# Patient Record
Sex: Male | Born: 1960 | Race: White | Hispanic: No | Marital: Married | Smoking: Never smoker
Health system: Southern US, Community
[De-identification: ages and names within clinical notes are randomized; demographics above are authoritative.]

## PROBLEM LIST (undated history)

## (undated) ENCOUNTER — Ambulatory Visit: Admission: EM | Payer: 59

## (undated) DIAGNOSIS — G479 Sleep disorder, unspecified: Secondary | ICD-10-CM

## (undated) DIAGNOSIS — E785 Hyperlipidemia, unspecified: Secondary | ICD-10-CM

## (undated) DIAGNOSIS — I10 Essential (primary) hypertension: Secondary | ICD-10-CM

## (undated) HISTORY — DX: Essential (primary) hypertension: I10

## (undated) HISTORY — DX: Hyperlipidemia, unspecified: E78.5

## (undated) HISTORY — DX: Sleep disorder, unspecified: G47.9

---

## 2004-03-11 ENCOUNTER — Ambulatory Visit: Payer: Self-pay | Admitting: Unknown Physician Specialty

## 2006-10-26 ENCOUNTER — Ambulatory Visit: Payer: Self-pay | Admitting: Emergency Medicine

## 2006-11-04 ENCOUNTER — Ambulatory Visit: Payer: Self-pay | Admitting: Internal Medicine

## 2006-11-10 ENCOUNTER — Ambulatory Visit: Payer: Self-pay | Admitting: Internal Medicine

## 2009-08-14 ENCOUNTER — Ambulatory Visit: Payer: Self-pay | Admitting: Family Medicine

## 2009-09-25 ENCOUNTER — Ambulatory Visit: Payer: Self-pay | Admitting: Family Medicine

## 2012-02-10 ENCOUNTER — Ambulatory Visit: Payer: Self-pay | Admitting: Unknown Physician Specialty

## 2012-02-24 ENCOUNTER — Ambulatory Visit: Payer: Self-pay | Admitting: Physical Medicine and Rehabilitation

## 2012-12-17 ENCOUNTER — Ambulatory Visit: Payer: Self-pay | Admitting: Unknown Physician Specialty

## 2013-05-05 ENCOUNTER — Ambulatory Visit: Payer: Self-pay | Admitting: Family Medicine

## 2013-05-27 ENCOUNTER — Ambulatory Visit: Payer: Self-pay | Admitting: Vascular Surgery

## 2016-04-11 DIAGNOSIS — F5101 Primary insomnia: Secondary | ICD-10-CM | POA: Diagnosis not present

## 2016-04-11 DIAGNOSIS — L409 Psoriasis, unspecified: Secondary | ICD-10-CM | POA: Diagnosis not present

## 2016-04-11 DIAGNOSIS — E7800 Pure hypercholesterolemia, unspecified: Secondary | ICD-10-CM | POA: Diagnosis not present

## 2016-07-08 DIAGNOSIS — R05 Cough: Secondary | ICD-10-CM | POA: Diagnosis not present

## 2016-07-14 DIAGNOSIS — J40 Bronchitis, not specified as acute or chronic: Secondary | ICD-10-CM | POA: Diagnosis not present

## 2016-07-14 DIAGNOSIS — R05 Cough: Secondary | ICD-10-CM | POA: Diagnosis not present

## 2016-10-16 DIAGNOSIS — E7800 Pure hypercholesterolemia, unspecified: Secondary | ICD-10-CM | POA: Diagnosis not present

## 2016-10-16 DIAGNOSIS — Z0000 Encounter for general adult medical examination without abnormal findings: Secondary | ICD-10-CM | POA: Diagnosis not present

## 2016-10-16 DIAGNOSIS — L409 Psoriasis, unspecified: Secondary | ICD-10-CM | POA: Diagnosis not present

## 2016-11-21 DIAGNOSIS — J3489 Other specified disorders of nose and nasal sinuses: Secondary | ICD-10-CM | POA: Diagnosis not present

## 2016-11-21 DIAGNOSIS — G4733 Obstructive sleep apnea (adult) (pediatric): Secondary | ICD-10-CM | POA: Diagnosis not present

## 2016-11-21 DIAGNOSIS — J342 Deviated nasal septum: Secondary | ICD-10-CM | POA: Diagnosis not present

## 2016-12-05 DIAGNOSIS — J40 Bronchitis, not specified as acute or chronic: Secondary | ICD-10-CM | POA: Diagnosis not present

## 2017-01-04 DIAGNOSIS — Z23 Encounter for immunization: Secondary | ICD-10-CM | POA: Diagnosis not present

## 2017-03-30 ENCOUNTER — Other Ambulatory Visit: Payer: Self-pay | Admitting: Family Medicine

## 2017-03-30 DIAGNOSIS — L539 Erythematous condition, unspecified: Secondary | ICD-10-CM

## 2017-03-30 DIAGNOSIS — M7989 Other specified soft tissue disorders: Secondary | ICD-10-CM

## 2017-04-01 ENCOUNTER — Ambulatory Visit: Payer: 59

## 2017-04-02 ENCOUNTER — Ambulatory Visit (INDEPENDENT_AMBULATORY_CARE_PROVIDER_SITE_OTHER): Payer: 59 | Admitting: Vascular Surgery

## 2017-04-02 ENCOUNTER — Other Ambulatory Visit (INDEPENDENT_AMBULATORY_CARE_PROVIDER_SITE_OTHER): Payer: Self-pay | Admitting: Vascular Surgery

## 2017-04-02 ENCOUNTER — Ambulatory Visit (INDEPENDENT_AMBULATORY_CARE_PROVIDER_SITE_OTHER): Payer: 59

## 2017-04-02 ENCOUNTER — Encounter (INDEPENDENT_AMBULATORY_CARE_PROVIDER_SITE_OTHER): Payer: Self-pay | Admitting: Vascular Surgery

## 2017-04-02 VITALS — BP 135/93 | HR 68 | Resp 18 | Ht 70.0 in | Wt 232.0 lb

## 2017-04-02 DIAGNOSIS — I8002 Phlebitis and thrombophlebitis of superficial vessels of left lower extremity: Secondary | ICD-10-CM

## 2017-04-02 DIAGNOSIS — R601 Generalized edema: Secondary | ICD-10-CM

## 2017-04-02 DIAGNOSIS — E785 Hyperlipidemia, unspecified: Secondary | ICD-10-CM

## 2017-04-02 DIAGNOSIS — M7989 Other specified soft tissue disorders: Secondary | ICD-10-CM

## 2017-04-03 ENCOUNTER — Encounter (INDEPENDENT_AMBULATORY_CARE_PROVIDER_SITE_OTHER): Payer: Self-pay | Admitting: Vascular Surgery

## 2017-04-03 DIAGNOSIS — I8002 Phlebitis and thrombophlebitis of superficial vessels of left lower extremity: Secondary | ICD-10-CM | POA: Insufficient documentation

## 2017-04-03 DIAGNOSIS — E785 Hyperlipidemia, unspecified: Secondary | ICD-10-CM | POA: Insufficient documentation

## 2017-04-03 NOTE — Progress Notes (Signed)
Subjective:    Patient ID: Kurt Hammond, male    DOB: 29-May-1960, 57 y.o.   MRN: [id number] Chief Complaint  Patient presents with  . Follow-up    Left leg swelling and redness   Patient presents with a chief complaint of left lower extremity "swelling".  The patient was last seen in 2015 and diagnosed with an extensive left lower extremity great saphenous vein thrombo-phlebitis.  At that time, the patient was placed on anticoagulation due to the extensive nature of his blood clot.  Since his initial diagnosis, the patient has not continued engaging in conservative therapy including wearing medical grade 1 compression stockings and elevating his legs.  The patient has noticed about a week's worsening of left lower extremity swelling.  He also notes a "hard spot" noted to the front of his left calf.  This "spot" is very tender and red.  The patient denies any claudication-like symptoms, rest pain or ulceration to the lower extremity.  The patient denies any fever, nausea or vomiting.  The patient denies any chest pain or shortness of breath.   Review of Systems  Constitutional: Negative.   HENT: Negative.   Eyes: Negative.   Respiratory: Negative.   Cardiovascular: Positive for leg swelling.  Gastrointestinal: Negative.   Endocrine: Negative.   Genitourinary: Negative.   Musculoskeletal: Negative.   Skin: Negative.   Allergic/Immunologic: Negative.   Neurological: Negative.   Hematological: Negative.   Psychiatric/Behavioral: Negative.       Objective:   Physical Exam  Constitutional: He is oriented to person, place, and time. He appears well-developed and well-nourished. No distress.  HENT:  Head: Normocephalic and atraumatic.  Eyes: Conjunctivae are normal. Pupils are equal, round, and reactive to light.  Neck: Normal range of motion.  Cardiovascular: Normal rate, regular rhythm, normal heart sounds and intact distal pulses.  Pulses:      Radial pulses are 2+ on the right  side, and 2+ on the left side.  Left lower extremity: There is no pain with dorsiflexion.  There is no pain with palpation.  There is a warm hard nodule located to the front of the left calf measuring approximately 2 cm x 2 cm.  Skin is intact.  This area is erythematous and warm.  Pulmonary/Chest: Effort normal and breath sounds normal.  Musculoskeletal: Normal range of motion. He exhibits edema (Mild left lower extremity edema).  Neurological: He is alert and oriented to person, place, and time.  Skin: He is not diaphoretic.  Psychiatric: He has a normal mood and affect. His behavior is normal. Judgment and thought content normal.  Vitals reviewed.  BP (!) 135/93 (BP Location: Right Arm, Patient Position: Sitting)   Pulse 68   Resp 18   Ht 5\' 10"  (1.778 m)   Wt 232 lb (105.2 kg)   BMI 33.29 kg/m   Past Medical History:  Diagnosis Date  . Hyperlipidemia   . Hypertension   . Sleep difficulties    Social History   Socioeconomic History  . Marital status: Married    Spouse name: Not on file  . Number of children: Not on file  . Years of education: Not on file  . Highest education level: Not on file  Social Needs  . Financial resource strain: Not on file  . Food insecurity - worry: Not on file  . Food insecurity - inability: Not on file  . Transportation needs - medical: Not on file  . Transportation needs - non-medical: Not on  file  Occupational History  . Not on file  Tobacco Use  . Smoking status: Never Smoker  . Smokeless tobacco: Never Used  Substance and Sexual Activity  . Alcohol use: Yes  . Drug use: Not on file  . Sexual activity: Not on file  Other Topics Concern  . Not on file  Social History Narrative  . Not on file   No family history on file.  No Known Allergies     Assessment & Plan:  Patient presents with a chief complaint of left lower extremity "swelling".  The patient was last seen in 2015 and diagnosed with an extensive left lower extremity  great saphenous vein thrombo-phlebitis.  At that time, the patient was placed on anticoagulation due to the extensive nature of his blood clot.  Since his initial diagnosis, the patient has not continued engaging in conservative therapy including wearing medical grade 1 compression stockings and elevating his legs.  The patient has noticed about a week's worsening of left lower extremity swelling.  He also notes a "hard spot" noted to the front of his left calf.  This "spot" is very tender and red.  The patient denies any claudication-like symptoms, rest pain or ulceration to the lower extremity.  The patient denies any fever, nausea or vomiting.  The patient denies any chest pain or shortness of breath.  1. Thrombophlebitis of superficial veins of left lower extremity - New The patient underwent a STAT left lower extremity DVT study which was notable for no deep vein thrombosis.  There is an acute superficial thrombosis of the great saphenous vein in the proximal calf region and superficial varicosities of the proximal calf region. Recommend baby aspirin daily, warm compresses to the area, wearing medical grade one compression stockings on a daily basis, elevation and remaining active. I also recommended the patient undergo a formal venous workup as he may be prone to experiencing superficial vein thrombosis if he has venous disease. The patient will think about this and if he decides to move forward he will make an appointment If the patient experiences any fever nausea vomiting worsening pain or erythema or breakdown the skin he will call the office  - VAS US LOWER EXTREMITY VENOUS (DVT); Future  2. Hyperlipidemia, unspecified hyperlipidemia type - Stable Encouraged good control as its slows the progression of atherosclerotic disease  Current Outpatient Medications on File Prior to Visit  Medication Sig Dispense Refill  . albuterol (VENTOLIN HFA) 108 (90 Base) MCG/ACT inhaler Inhale into the  lungs.    . pravastatin (PRAVACHOL) 10 MG tablet pravastatin 10 mg tablet  TK 1 T PO QD    . zolpidem (AMBIEN) 10 MG tablet TK 1 T PO QHS PRN  0  . lovastatin (MEVACOR) 20 MG tablet Take by mouth.     No current facility-administered medications on file prior to visit.    There are no Patient Instructions on file for this visit. No Follow-up on file.  Ahmani Daoud A Moriyah Byington, PA-C

## 2017-04-10 ENCOUNTER — Ambulatory Visit (INDEPENDENT_AMBULATORY_CARE_PROVIDER_SITE_OTHER): Payer: Self-pay | Admitting: Vascular Surgery

## 2017-04-13 DIAGNOSIS — E7800 Pure hypercholesterolemia, unspecified: Secondary | ICD-10-CM | POA: Diagnosis not present

## 2017-04-13 DIAGNOSIS — F5101 Primary insomnia: Secondary | ICD-10-CM | POA: Diagnosis not present

## 2017-04-13 DIAGNOSIS — L409 Psoriasis, unspecified: Secondary | ICD-10-CM | POA: Diagnosis not present

## 2017-04-14 DIAGNOSIS — E7800 Pure hypercholesterolemia, unspecified: Secondary | ICD-10-CM | POA: Diagnosis not present

## 2017-04-14 DIAGNOSIS — N410 Acute prostatitis: Secondary | ICD-10-CM | POA: Diagnosis not present

## 2017-04-20 DIAGNOSIS — K429 Umbilical hernia without obstruction or gangrene: Secondary | ICD-10-CM | POA: Diagnosis not present

## 2017-10-19 DIAGNOSIS — E7800 Pure hypercholesterolemia, unspecified: Secondary | ICD-10-CM | POA: Diagnosis not present

## 2017-10-19 DIAGNOSIS — L409 Psoriasis, unspecified: Secondary | ICD-10-CM | POA: Diagnosis not present

## 2017-10-19 DIAGNOSIS — Z125 Encounter for screening for malignant neoplasm of prostate: Secondary | ICD-10-CM | POA: Diagnosis not present

## 2017-10-19 DIAGNOSIS — Z0000 Encounter for general adult medical examination without abnormal findings: Secondary | ICD-10-CM | POA: Diagnosis not present

## 2017-11-17 DIAGNOSIS — G4733 Obstructive sleep apnea (adult) (pediatric): Secondary | ICD-10-CM | POA: Diagnosis not present

## 2017-11-24 DIAGNOSIS — G4733 Obstructive sleep apnea (adult) (pediatric): Secondary | ICD-10-CM | POA: Diagnosis not present

## 2017-12-16 DIAGNOSIS — G4733 Obstructive sleep apnea (adult) (pediatric): Secondary | ICD-10-CM | POA: Diagnosis not present

## 2018-01-11 DIAGNOSIS — Z23 Encounter for immunization: Secondary | ICD-10-CM | POA: Diagnosis not present

## 2018-04-30 DIAGNOSIS — L409 Psoriasis, unspecified: Secondary | ICD-10-CM | POA: Diagnosis not present

## 2018-04-30 DIAGNOSIS — F5101 Primary insomnia: Secondary | ICD-10-CM | POA: Diagnosis not present

## 2018-04-30 DIAGNOSIS — E7800 Pure hypercholesterolemia, unspecified: Secondary | ICD-10-CM | POA: Diagnosis not present

## 2018-05-07 DIAGNOSIS — R079 Chest pain, unspecified: Secondary | ICD-10-CM | POA: Diagnosis not present

## 2018-11-25 ENCOUNTER — Other Ambulatory Visit: Payer: Self-pay | Admitting: Physician Assistant

## 2018-11-25 ENCOUNTER — Other Ambulatory Visit (HOSPITAL_COMMUNITY): Payer: Self-pay | Admitting: Physician Assistant

## 2018-11-25 DIAGNOSIS — M25512 Pain in left shoulder: Secondary | ICD-10-CM

## 2018-12-08 ENCOUNTER — Other Ambulatory Visit: Payer: Self-pay

## 2018-12-08 ENCOUNTER — Ambulatory Visit
Admission: RE | Admit: 2018-12-08 | Discharge: 2018-12-08 | Disposition: A | Discharge status: Home / Self Care | Payer: 59 | Source: Ambulatory Visit | Attending: Physician Assistant | Admitting: Physician Assistant

## 2018-12-08 DIAGNOSIS — M25512 Pain in left shoulder: Secondary | ICD-10-CM | POA: Diagnosis not present

## 2019-02-24 ENCOUNTER — Other Ambulatory Visit: Payer: Self-pay

## 2019-02-24 ENCOUNTER — Emergency Department: Payer: 59

## 2019-02-24 ENCOUNTER — Emergency Department
Admission: EM | Admit: 2019-02-24 | Discharge: 2019-02-24 | Disposition: A | Discharge status: Home / Self Care | Payer: 59 | Attending: Emergency Medicine | Admitting: Emergency Medicine

## 2019-02-24 DIAGNOSIS — R0789 Other chest pain: Secondary | ICD-10-CM | POA: Diagnosis present

## 2019-02-24 DIAGNOSIS — I10 Essential (primary) hypertension: Secondary | ICD-10-CM | POA: Diagnosis not present

## 2019-02-24 DIAGNOSIS — I300 Acute nonspecific idiopathic pericarditis: Secondary | ICD-10-CM | POA: Diagnosis not present

## 2019-02-24 DIAGNOSIS — Z79899 Other long term (current) drug therapy: Secondary | ICD-10-CM | POA: Insufficient documentation

## 2019-02-24 LAB — CBC
HCT: 45.5 % (ref 39.0–52.0)
Hemoglobin: 16 g/dL (ref 13.0–17.0)
MCH: 30.8 pg (ref 26.0–34.0)
MCHC: 35.2 g/dL (ref 30.0–36.0)
MCV: 87.5 fL (ref 80.0–100.0)
Platelets: 211 10*3/uL (ref 150–400)
RBC: 5.2 MIL/uL (ref 4.22–5.81)
RDW: 13.1 % (ref 11.5–15.5)
WBC: 6.8 10*3/uL (ref 4.0–10.5)
nRBC: 0 % (ref 0.0–0.2)

## 2019-02-24 LAB — TROPONIN I (HIGH SENSITIVITY)
Troponin I (High Sensitivity): 4 ng/L (ref <18)
Troponin I (High Sensitivity): 3 ng/L (ref <18)

## 2019-02-24 LAB — BASIC METABOLIC PANEL
Anion gap: 11 (ref 5–15)
BUN: 16 mg/dL (ref 6–20)
CO2: 22 mmol/L (ref 22–32)
Calcium: 9.5 mg/dL (ref 8.9–10.3)
Chloride: 102 mmol/L (ref 98–111)
Creatinine, Ser: 0.85 mg/dL (ref 0.61–1.24)
GFR calc Af Amer: >60 mL/min (ref >60)
GFR calc non Af Amer: >60 mL/min (ref >60)
Glucose, Bld: 100 mg/dL — ABNORMAL HIGH (ref 70–99)
Potassium: 4 mmol/L (ref 3.5–5.1)
Sodium: 135 mmol/L (ref 135–145)

## 2019-02-24 LAB — SEDIMENTATION RATE: Sed Rate: 3 mm/hr (ref 0–20)

## 2019-02-24 MED ORDER — KETOROLAC TROMETHAMINE 30 MG/ML IJ SOLN
15.0000 mg | Freq: Once | INTRAMUSCULAR | Status: AC
  Administered 2019-02-24: 15 mg via INTRAVENOUS
  Filled 2019-02-24: qty 1

## 2019-02-24 MED ORDER — COLCHICINE 0.6 MG PO TABS: 0.6000 mg | ORAL_TABLET | Freq: Two times a day (BID) | ORAL | 0 refills | Status: AC

## 2019-02-24 MED ORDER — NAPROXEN 500 MG PO TABS: 500.0000 mg | ORAL_TABLET | Freq: Two times a day (BID) | ORAL | 0 refills | Status: AC

## 2019-02-24 NOTE — ED Provider Notes (Signed)
[facility] Emergency Department Provider Note   ____________________________________________   First MD Initiated Contact with Patient 02/24/19 1155     (approximate)  I have reviewed the triage vital signs and the nursing notes.   HISTORY  Chief Complaint Chest Pain    HPI Kurt Hammond is a 58 y.o. male with past medical history of hypertension and hyperlipidemia who presents to the ED complaining of chest pain.  Patient reports he developed chest pain yesterday evening which is sharp and worse when he goes to take a deep breath.  He thought it might be related to acid reflux and took 2 Tums last night with no relief.  Pain has been constant and continued into the day today.  He noticed it was very difficult for him to lay flat last night and he was unable to sleep due to this.  He denies any recent fevers or cough, is not aware of any sick contacts.  He has not noticed any pain or swelling in his legs.        Past Medical History:  Diagnosis Date  . Hyperlipidemia   . Hypertension   . Sleep difficulties     Patient Active Problem List   Diagnosis Date Noted  . Thrombophlebitis of superficial veins of left lower extremity 04/03/2017  . Hyperlipidemia 04/03/2017    History reviewed. No pertinent surgical history.  Prior to Admission medications   Medication Sig Start Date End Date Taking? Authorizing Provider  albuterol (VENTOLIN HFA) 108 (90 Base) MCG/ACT inhaler Inhale 2 puffs into the lungs every 6 (six) hours as needed for wheezing or shortness of breath.    Yes [provider]  lovastatin (MEVACOR) 20 MG tablet Take 20 mg by mouth daily after supper.    Yes [provider]  pantoprazole (PROTONIX) 20 MG tablet Take 20 mg by mouth daily.   Yes [provider]  triamcinolone cream (KENALOG) 0.5 % Apply 1 application topically 2 (two) times daily as needed (psoriasis).   Yes [provider]  zolpidem  (AMBIEN) 10 MG tablet Take 10 mg by mouth at bedtime as needed for sleep.    Yes [provider]  colchicine 0.6 MG tablet Take 1 tablet (0.6 mg total) by mouth 2 (two) times daily. 02/24/19 03/26/19  Chesley Noon, MD  naproxen (NAPROSYN) 500 MG tablet Take 1 tablet (500 mg total) by mouth 2 (two) times daily with a meal for 14 days. 02/24/19 03/10/19  Chesley Noon, MD    Allergies Patient has no known allergies.  History reviewed. No pertinent family history.  Social History Social History   Tobacco Use  . Smoking status: Never Smoker  . Smokeless tobacco: Never Used  Substance Use Topics  . Alcohol use: Yes  . Drug use: Never    Review of Systems  Constitutional: No fever/chills Eyes: No visual changes. ENT: No sore throat. Cardiovascular: Positive for chest pain. Respiratory: Denies shortness of breath. Gastrointestinal: No abdominal pain.  No nausea, no vomiting.  No diarrhea.  No constipation. Genitourinary: Negative for dysuria. Musculoskeletal: Negative for back pain. Skin: Negative for rash. Neurological: Negative for headaches, focal weakness or numbness.  ____________________________________________   PHYSICAL EXAM:  VITAL SIGNS: ED Triage Vitals  Enc Vitals Group     BP 02/24/19 1117 131/85     Pulse Rate 02/24/19 1117 75     Resp 02/24/19 1117 16     Temp 02/24/19 1117 98.6 F (37 C)  Temp Source 02/24/19 1117 Oral     SpO2 02/24/19 1117 98 %     Weight 02/24/19 1119 210 lb (95.3 kg)     Height 02/24/19 1119 5\' 10"  (1.778 m)     Head Circumference --      Peak Flow --      Pain Score 02/24/19 1119 2     Pain Loc --      Pain Edu? --      Excl. in GC? --     Constitutional: Alert and oriented. Eyes: Conjunctivae are normal. Head: Atraumatic. Nose: No congestion/rhinnorhea. Mouth/Throat: Mucous membranes are moist. Neck: Normal ROM Cardiovascular: Normal rate, regular rhythm. Grossly normal heart sounds.  No chest wall  tenderness. Respiratory: Normal respiratory effort.  No retractions. Lungs CTAB. Gastrointestinal: Soft and nontender. No distention. Genitourinary: deferred Musculoskeletal: No lower extremity tenderness nor edema. Neurologic:  Normal speech and language. No gross focal neurologic deficits are appreciated. Skin:  Skin is warm, dry and intact. No rash noted. Psychiatric: Mood and affect are normal. Speech and behavior are normal.  ____________________________________________   LABS (all labs ordered are listed, but only abnormal results are displayed)  Labs Reviewed  BASIC METABOLIC PANEL - Abnormal; Notable for the following components:      Result Value   Glucose, Bld 100 (*)    All other components within normal limits  CBC  SEDIMENTATION RATE  C-REACTIVE PROTEIN  TROPONIN I (HIGH SENSITIVITY)  TROPONIN I (HIGH SENSITIVITY)   ____________________________________________  EKG  ED ECG REPORT I, Chesley Noonharles Starnisha Batrez, the attending physician, personally viewed and interpreted this ECG.   Date: 02/24/2019  EKG Time: 11:07  Rate: 71  Rhythm: normal sinus rhythm  Axis: Normal  Intervals:none  ST&T Change: Diffuse ST elevation consistent with pericarditis   PROCEDURES  Procedure(s) performed (including Critical Care):  Procedures   ____________________________________________   INITIAL IMPRESSION / ASSESSMENT AND PLAN / ED COURSE       58 year old male with history of hypertension and hyperlipidemia presents to the ED with approximately 24 hours of sharp pleuritic chest pain that seems to be worse when he goes to lay flat.  EKG appears consistent with pericarditis and this is also consistent with his clinical picture.  Troponin is negative, low suspicion for ACS but will recheck second set.  Remainder of labs are unremarkable and chest x-ray negative for acute process, bedside echocardiogram performed and shows normal EF with no RV dilation or pericardial effusion.  Case  was discussed with Dr. Mariah MillingGollan of cardiology, who recommends starting patient on colchicine and NSAIDs, will provide dose of Toradol here in the ED.  No obvious inciting factor to patient's pericarditis as he denies any recent viral illness.  If repeat troponin is negative, he would be appropriate for outpatient management and follow-up with cardiology.  Repeat troponin within normal limits, patient's symptoms improved following dose of Toradol.  Patient agrees with prescribed naproxen and colchicine, plan to follow-up with cardiology next week and counseled patient to return to the ED for new or worsening symptoms.  Patient agrees with plan.        ____________________________________________   FINAL CLINICAL IMPRESSION(S) / ED DIAGNOSES  Final diagnoses:  Acute idiopathic pericarditis  Atypical chest pain     ED Discharge Orders         Ordered    naproxen (NAPROSYN) 500 MG tablet  2 times daily with meals     02/24/19 1518    colchicine 0.6 MG tablet  2  times daily     02/24/19 1518           Note:  This document was prepared using Dragon voice recognition software and may include unintentional dictation errors.   Blake Divine, MD 02/24/19 [phone]

## 2019-02-24 NOTE — ED Triage Notes (Signed)
Pt arrives via pov. Raking leaves last night, eat pizza, drinking etoh. Pressure in chest when pt attempting to lay down. When taking deep breath in pt c/o sharp pain in chest.Took 3 tums, 1g tylenol. Pt states he feels like he "needs to burp", but cant. Unable to lay flat. Denies any dizziness,  N/V/D. NAD noted at this time.

## 2019-02-25 LAB — C-REACTIVE PROTEIN: CRP: 5.4 mg/dL — ABNORMAL HIGH (ref <1.0)

## 2021-09-03 ENCOUNTER — Other Ambulatory Visit: Payer: Self-pay | Admitting: Internal Medicine

## 2021-09-03 DIAGNOSIS — R0602 Shortness of breath: Secondary | ICD-10-CM

## 2021-09-10 ENCOUNTER — Ambulatory Visit
Admission: RE | Admit: 2021-09-10 | Discharge: 2021-09-10 | Disposition: A | Discharge status: Home / Self Care | Payer: 59 | Source: Ambulatory Visit | Attending: Internal Medicine | Admitting: Internal Medicine

## 2021-09-10 DIAGNOSIS — R0602 Shortness of breath: Secondary | ICD-10-CM | POA: Insufficient documentation

## 2023-04-16 ENCOUNTER — Ambulatory Visit (INDEPENDENT_AMBULATORY_CARE_PROVIDER_SITE_OTHER): Payer: 59 | Admitting: Vascular Surgery

## 2023-04-16 ENCOUNTER — Encounter (INDEPENDENT_AMBULATORY_CARE_PROVIDER_SITE_OTHER): Payer: Self-pay | Admitting: Vascular Surgery

## 2023-04-16 VITALS — BP 142/86 | HR 69 | Resp 16 | Wt 241.0 lb

## 2023-04-16 DIAGNOSIS — K219 Gastro-esophageal reflux disease without esophagitis: Secondary | ICD-10-CM | POA: Diagnosis not present

## 2023-04-16 DIAGNOSIS — I8002 Phlebitis and thrombophlebitis of superficial vessels of left lower extremity: Secondary | ICD-10-CM | POA: Diagnosis not present

## 2023-04-16 DIAGNOSIS — E785 Hyperlipidemia, unspecified: Secondary | ICD-10-CM

## 2023-04-16 DIAGNOSIS — M150 Primary generalized (osteo)arthritis: Secondary | ICD-10-CM | POA: Diagnosis not present

## 2023-04-18 ENCOUNTER — Encounter (INDEPENDENT_AMBULATORY_CARE_PROVIDER_SITE_OTHER): Payer: Self-pay | Admitting: Vascular Surgery

## 2023-04-18 DIAGNOSIS — M1990 Unspecified osteoarthritis, unspecified site: Secondary | ICD-10-CM | POA: Insufficient documentation

## 2023-04-18 DIAGNOSIS — I809 Phlebitis and thrombophlebitis of unspecified site: Secondary | ICD-10-CM | POA: Insufficient documentation

## 2023-04-18 DIAGNOSIS — K219 Gastro-esophageal reflux disease without esophagitis: Secondary | ICD-10-CM | POA: Insufficient documentation

## 2023-04-18 NOTE — Progress Notes (Signed)
MRN : [id number]  Kurt Hammond is a 63 y.o. (04-27-1960) male who presents with chief complaint of legs hurt and swell.  History of Present Illness:   The patient presents to the office for evaluation of STP.  STP was identified at [facility] by Duplex ultrasound dated 04/01/2022.    The initial symptoms were pain and swelling in the lower extremity.  The patient notes the affected leg is painful with dependency and swells.  Symptoms are much better with elevation.  The patient notes minimal edema in the morning which steadily worsens throughout the day.  He has been using Naprosyn which has helped tremendously.  At this point he is decreased his dose to 1 pill a day.  The patient has not been using compression therapy at this point.  No SOB or pleuritic chest pains.  No cough or hemoptysis.  No blood per rectum or blood in any sputum.  No excessive bruising per the patient.   No recent shortening of the patient's walking distance or new symptoms consistent with claudication.  No history of rest pain symptoms. No new ulcers or wounds of the lower extremities have occurred.  The patient denies amaurosis fugax or recent TIA symptoms. There are no recent neurological changes noted. No recent episodes of angina or shortness of breath documented.   Current Meds  Medication Sig   albuterol (VENTOLIN HFA) 108 (90 Base) MCG/ACT inhaler Inhale 2 puffs into the lungs every 6 (six) hours as needed for wheezing or shortness of breath.    aspirin EC 81 MG tablet Take 81 mg by mouth daily. Swallow whole.   colchicine 0.6 MG tablet Take 1 tablet (0.6 mg total) by mouth 2 (two) times daily.   lovastatin (MEVACOR) 20 MG tablet Take 20 mg by mouth daily after supper.    naproxen (NAPROSYN) 500 MG tablet Take 500 mg by mouth 2 (two) times daily with a meal.   pantoprazole (PROTONIX) 20 MG tablet Take 20 mg by mouth daily.   triamcinolone cream (KENALOG) 0.5 % Apply 1 application topically 2 (two)  times daily as needed (psoriasis).   zolpidem (AMBIEN) 10 MG tablet Take 10 mg by mouth at bedtime as needed for sleep.     Past Medical History:  Diagnosis Date   Hyperlipidemia    Hypertension    Sleep difficulties     No past surgical history on file.  Social History Social History   Tobacco Use   Smoking status: Never   Smokeless tobacco: Never  Substance Use Topics   Alcohol use: Yes   Drug use: Never    Family History Family History  Problem Relation Age of Onset   Cancer Mother    Heart disease Father    Heart disease Maternal Grandfather    Heart attack Maternal Grandfather    Heart disease Paternal Grandfather    Heart attack Paternal Grandfather     No Known Allergies   REVIEW OF SYSTEMS (Negative unless checked)  Constitutional: [] Weight loss  [] Fever  [] Chills Cardiac: [] Chest pain   [] Chest pressure   [] Palpitations   [] Shortness of breath when laying flat   [] Shortness of breath with exertion. Vascular:  [] Pain in legs with walking   [x] Pain in legs at rest  [] History of DVT   [] Phlebitis   [x] Swelling in legs   [] Varicose veins   [] Non-healing ulcers Pulmonary:   [] Uses home oxygen   [] Productive cough   [] Hemoptysis   []   Wheeze  [] COPD   [] Asthma Neurologic:  [] Dizziness   [] Seizures   [] History of stroke   [] History of TIA  [] Aphasia   [] Vissual changes   [] Weakness or numbness in arm   [] Weakness or numbness in leg Musculoskeletal:   [] Joint swelling   [x] Joint pain   [] Low back pain Hematologic:  [] Easy bruising  [] Easy bleeding   [] Hypercoagulable state   [] Anemic Gastrointestinal:  [] Diarrhea   [] Vomiting  [x] Gastroesophageal reflux/heartburn   [] Difficulty swallowing. Genitourinary:  [] Chronic kidney disease   [] Difficult urination  [] Frequent urination   [] Blood in urine Skin:  [] Rashes   [] Ulcers  Psychological:  [] History of anxiety   []  History of major depression.  Physical Examination  Vitals:   04/16/23 0909  BP: (!) 142/86  Pulse:  69  Resp: 16  Weight: 241 lb (109.3 kg)   Body mass index is 34.58 kg/m. Gen: WD/WN, NAD Head: [place]/AT, No temporalis wasting.  Ear/Nose/Throat: Hearing grossly intact, nares w/o erythema or drainage, pinna without lesions Eyes: PER, EOMI, sclera nonicteric.  Neck: Supple, no gross masses.  No JVD.  Pulmonary:  Good air movement, no audible wheezing, no use of accessory muscles.  Cardiac: RRR, precordium not hyperdynamic. Vascular:  large varicosities present bilaterally left > right.  Palpable cord on the left.  Moderate venous stasis changes to the legs bilaterally.  1-2+ soft pitting edema. CEAP [id number]   Vessel Right Left  Radial Palpable Palpable  Gastrointestinal: soft, non-distended. No guarding/no peritoneal signs.  Musculoskeletal: M/S 5/5 throughout.  No deformity.  Neurologic: CN 2-12 intact. Pain and light touch intact in extremities.  Symmetrical.  Speech is fluent. Motor exam as listed above. Psychiatric: Judgment intact, Mood & affect appropriate for pt's clinical situation. Dermatologic: Venous rashes no ulcers noted.  No changes consistent with cellulitis. Lymph : No lichenification or skin changes of chronic lymphedema.  CBC Lab Results  Component Value Date   WBC 6.8 02/24/2019   HGB 16.0 02/24/2019   HCT 45.5 02/24/2019   MCV 87.5 02/24/2019   PLT 211 02/24/2019    BMET    Component Value Date/Time   NA 135 02/24/2019 1124   K 4.0 02/24/2019 1124   CL 102 02/24/2019 1124   CO2 22 02/24/2019 1124   GLUCOSE 100 (H) 02/24/2019 1124   BUN 16 02/24/2019 1124   CREATININE 0.85 02/24/2019 1124   CALCIUM 9.5 02/24/2019 1124   GFRNONAA >60 02/24/2019 1124   GFRAA >60 02/24/2019 1124   CrCl cannot be calculated (Patient's most recent lab result is older than the maximum 21 days allowed.).  COAG No results found for: "INR", "PROTIME"  Radiology No results found.   Assessment/Plan 1. Thrombophlebitis of superficial veins of left lower extremity  (Primary) Recommend:   No surgery or intervention at this point in time.  IVC filter is not indicated at present.  Patient's duplex ultrasound of the venous system shows STP in the left great saphenous system.  It appears that he has had 2 fairly separate episodes with the initial occurring in the more proximal saphenous vein and the most recent episode in the more distal saphenous vein and associated branches.   The patient does not require anticoagulation.  He will continue his NSAID therapy as needed, which he is reduced the dose appropriately.  Elevation was stressed, such as the use of a recliner.  I have reviewed with the patient STP and post phlebitic changes such as swelling and why it  causes symptoms such as pain.  I recommended to the patient to wear graduated compression stockings, beginning after three full days of anticoagulation.  Graduated compression should be worn on a daily basis. The patient should wear compression beginning first thing in the morning and removing them in the evening. The patient is instructed specifically not to sleep in the stockings.  In addition, behavioral modification including elevation during the day and avoidance of prolonged dependency will be initiated.    The patient will follow-up with me with noninvasive studies as ordered.  At that time the duplex will review the extent of the chronic changes within the great saphenous vein.  Should it recanalized then laser ablation to prevent future recurrences will be discussed.  If the saphenous vein has been ablated secondary to the superficial thrombophlebitis and the associated inflammation then sclerotherapy of any residual branches will be reviewed.  - VAS [place] LOWER EXTREMITY VENOUS REFLUX; Future  2. Hyperlipidemia, unspecified hyperlipidemia type Continue statin as ordered and reviewed, no changes at this time  3. Gastroesophageal reflux disease without esophagitis Continue PPI as already ordered,  this medication has been reviewed and there are no changes at this time.  Avoidence of caffeine and alcohol  Moderate elevation of the head of the bed   4. Primary osteoarthritis involving multiple joints Continue medications to treat the patient's degenerative disease as already ordered, these medications have been reviewed and there are no changes at this time.  Continued activity and therapy was stressed.    Levora Dredge, MD  04/18/2023 12:52 PM

## 2023-06-05 ENCOUNTER — Other Ambulatory Visit: Payer: Self-pay | Admitting: Internal Medicine

## 2023-06-05 DIAGNOSIS — R9431 Abnormal electrocardiogram [ECG] [EKG]: Secondary | ICD-10-CM

## 2023-06-05 DIAGNOSIS — R079 Chest pain, unspecified: Secondary | ICD-10-CM

## 2023-06-08 ENCOUNTER — Ambulatory Visit
Admission: RE | Admit: 2023-06-08 | Discharge: 2023-06-08 | Disposition: A | Discharge status: Home / Self Care | Source: Ambulatory Visit | Attending: Internal Medicine | Admitting: Internal Medicine

## 2023-06-08 ENCOUNTER — Other Ambulatory Visit: Payer: Self-pay | Admitting: Internal Medicine

## 2023-06-08 ENCOUNTER — Inpatient Hospital Stay: Admit: 2023-06-08 | Admitting: Internal Medicine

## 2023-06-08 ENCOUNTER — Encounter (HOSPITAL_COMMUNITY): Payer: Self-pay

## 2023-06-08 DIAGNOSIS — R079 Chest pain, unspecified: Secondary | ICD-10-CM | POA: Insufficient documentation

## 2023-06-08 DIAGNOSIS — R9431 Abnormal electrocardiogram [ECG] [EKG]: Secondary | ICD-10-CM | POA: Insufficient documentation

## 2023-06-09 ENCOUNTER — Other Ambulatory Visit: Payer: Self-pay

## 2023-06-09 ENCOUNTER — Encounter
Admission: EM | Disposition: A | Discharge status: Home / Self Care | Payer: Self-pay | Source: Home / Self Care | Attending: Internal Medicine

## 2023-06-09 ENCOUNTER — Inpatient Hospital Stay
Admission: EM | Admit: 2023-06-09 | Discharge: 2023-06-10 | DRG: 322 | Disposition: A | Discharge status: Home / Self Care | Attending: Internal Medicine | Admitting: Internal Medicine

## 2023-06-09 ENCOUNTER — Emergency Department

## 2023-06-09 DIAGNOSIS — I252 Old myocardial infarction: Secondary | ICD-10-CM

## 2023-06-09 DIAGNOSIS — R0609 Other forms of dyspnea: Secondary | ICD-10-CM

## 2023-06-09 DIAGNOSIS — Z79899 Other long term (current) drug therapy: Secondary | ICD-10-CM

## 2023-06-09 DIAGNOSIS — E785 Hyperlipidemia, unspecified: Secondary | ICD-10-CM | POA: Diagnosis present

## 2023-06-09 DIAGNOSIS — I214 Non-ST elevation (NSTEMI) myocardial infarction: Principal | ICD-10-CM | POA: Diagnosis present

## 2023-06-09 DIAGNOSIS — I2510 Atherosclerotic heart disease of native coronary artery without angina pectoris: Secondary | ICD-10-CM | POA: Diagnosis present

## 2023-06-09 DIAGNOSIS — K219 Gastro-esophageal reflux disease without esophagitis: Secondary | ICD-10-CM | POA: Diagnosis present

## 2023-06-09 DIAGNOSIS — I25110 Atherosclerotic heart disease of native coronary artery with unstable angina pectoris: Principal | ICD-10-CM | POA: Diagnosis present

## 2023-06-09 DIAGNOSIS — Z8249 Family history of ischemic heart disease and other diseases of the circulatory system: Secondary | ICD-10-CM

## 2023-06-09 DIAGNOSIS — I10 Essential (primary) hypertension: Secondary | ICD-10-CM | POA: Diagnosis present

## 2023-06-09 DIAGNOSIS — I4892 Unspecified atrial flutter: Secondary | ICD-10-CM | POA: Diagnosis present

## 2023-06-09 DIAGNOSIS — I2582 Chronic total occlusion of coronary artery: Secondary | ICD-10-CM | POA: Diagnosis present

## 2023-06-09 DIAGNOSIS — R0602 Shortness of breath: Secondary | ICD-10-CM

## 2023-06-09 DIAGNOSIS — Z955 Presence of coronary angioplasty implant and graft: Secondary | ICD-10-CM

## 2023-06-09 DIAGNOSIS — I259 Chronic ischemic heart disease, unspecified: Secondary | ICD-10-CM

## 2023-06-09 HISTORY — PX: CORONARY STENT INTERVENTION: CATH118234

## 2023-06-09 HISTORY — PX: LEFT HEART CATH AND CORONARY ANGIOGRAPHY: CATH118249

## 2023-06-09 LAB — CBC
HCT: 49.3 % (ref 39.0–52.0)
Hemoglobin: 15.4 g/dL (ref 13.0–17.0)
MCH: 31 pg (ref 26.0–34.0)
MCHC: 31.2 g/dL (ref 30.0–36.0)
MCV: 99.4 fL (ref 80.0–100.0)
Platelets: 272 10*3/uL (ref 150–400)
RBC: 4.96 MIL/uL (ref 4.22–5.81)
RDW: 12.8 % (ref 11.5–15.5)
WBC: 4.9 10*3/uL (ref 4.0–10.5)
nRBC: 0 % (ref 0.0–0.2)

## 2023-06-09 LAB — POCT ACTIVATED CLOTTING TIME
Activated Clotting Time: 337 s
Activated Clotting Time: 233 s

## 2023-06-09 LAB — BASIC METABOLIC PANEL
Anion gap: 9 (ref 5–15)
BUN: 15 mg/dL (ref 6–20)
CO2: 24 mmol/L (ref 22–32)
Calcium: 9.2 mg/dL (ref 8.9–10.3)
Chloride: 106 mmol/L (ref 98–111)
Creatinine, Ser: 1.12 mg/dL (ref 0.61–1.24)
GFR, Estimated: >60 mL/min (ref >60)
Glucose, Bld: 107 mg/dL — ABNORMAL HIGH (ref 70–99)
Potassium: 4.8 mmol/L (ref 3.5–5.1)
Sodium: 139 mmol/L (ref 135–145)

## 2023-06-09 LAB — TROPONIN I (HIGH SENSITIVITY)
Troponin I (High Sensitivity): 74 ng/L — ABNORMAL HIGH (ref <18)
Troponin I (High Sensitivity): 80 ng/L — ABNORMAL HIGH (ref <18)

## 2023-06-09 SURGERY — LEFT HEART CATH AND CORONARY ANGIOGRAPHY
Anesthesia: Moderate Sedation

## 2023-06-09 MED ORDER — FENTANYL CITRATE (PF) 100 MCG/2ML IJ SOLN
INTRAMUSCULAR | Status: AC
  Filled 2023-06-09: qty 2

## 2023-06-09 MED ORDER — FENTANYL CITRATE (PF) 100 MCG/2ML IJ SOLN
INTRAMUSCULAR | Status: DC | PRN
  Administered 2023-06-09 (×3): 25 ug via INTRAVENOUS

## 2023-06-09 MED ORDER — ASPIRIN 81 MG PO CHEW
81.0000 mg | CHEWABLE_TABLET | ORAL | Status: AC
  Administered 2023-06-09: 81 mg via ORAL

## 2023-06-09 MED ORDER — SODIUM CHLORIDE 0.9 % WEIGHT BASED INFUSION: 3.0000 mL/kg/h | INTRAVENOUS | Status: DC

## 2023-06-09 MED ORDER — ASPIRIN 81 MG PO CHEW
CHEWABLE_TABLET | ORAL | Status: AC
  Filled 2023-06-09: qty 1

## 2023-06-09 MED ORDER — MIDAZOLAM HCL 2 MG/2ML IJ SOLN
INTRAMUSCULAR | Status: AC
  Filled 2023-06-09: qty 2

## 2023-06-09 MED ORDER — ASPIRIN 325 MG PO TABS: 325.0000 mg | ORAL_TABLET | Freq: Once | ORAL | Status: DC

## 2023-06-09 MED ORDER — ASPIRIN 81 MG PO CHEW
CHEWABLE_TABLET | ORAL | Status: DC | PRN
  Administered 2023-06-09: 243 mg via ORAL

## 2023-06-09 MED ORDER — ONDANSETRON HCL 4 MG/2ML IJ SOLN: 4.0000 mg | Freq: Four times a day (QID) | INTRAMUSCULAR | Status: DC | PRN

## 2023-06-09 MED ORDER — HEPARIN BOLUS VIA INFUSION
2500.0000 [IU] | Freq: Once | INTRAVENOUS | Status: AC
  Administered 2023-06-09: 2500 [IU] via INTRAVENOUS
  Filled 2023-06-09: qty 2500

## 2023-06-09 MED ORDER — HEPARIN (PORCINE) IN NACL 1000-0.9 UT/500ML-% IV SOLN
INTRAVENOUS | Status: AC
  Filled 2023-06-09: qty 1000

## 2023-06-09 MED ORDER — SODIUM CHLORIDE 0.9 % WEIGHT BASED INFUSION
3.0000 mL/kg/h | INTRAVENOUS | Status: AC
  Administered 2023-06-09: 3 mL/kg/h via INTRAVENOUS

## 2023-06-09 MED ORDER — ASPIRIN 81 MG PO TBEC: 81.0000 mg | DELAYED_RELEASE_TABLET | Freq: Every day | ORAL | Status: DC

## 2023-06-09 MED ORDER — SODIUM CHLORIDE 0.9 % IV SOLN: 250.0000 mL | INTRAVENOUS | Status: DC | PRN

## 2023-06-09 MED ORDER — ASPIRIN 81 MG PO CHEW: 81.0000 mg | CHEWABLE_TABLET | Freq: Every day | ORAL | Status: DC

## 2023-06-09 MED ORDER — TICAGRELOR 90 MG PO TABS
ORAL_TABLET | ORAL | Status: AC
  Filled 2023-06-09: qty 2

## 2023-06-09 MED ORDER — SODIUM CHLORIDE 0.9 % WEIGHT BASED INFUSION: 1.0000 mL/kg/h | INTRAVENOUS | Status: DC

## 2023-06-09 MED ORDER — TICAGRELOR 90 MG PO TABS
ORAL_TABLET | ORAL | Status: DC | PRN
  Administered 2023-06-09: 180 mg via ORAL

## 2023-06-09 MED ORDER — ASPIRIN 81 MG PO CHEW: 81.0000 mg | CHEWABLE_TABLET | ORAL | Status: DC

## 2023-06-09 MED ORDER — HEPARIN SODIUM (PORCINE) 1000 UNIT/ML IJ SOLN
INTRAMUSCULAR | Status: DC | PRN
  Administered 2023-06-09: 8000 [IU] via INTRAVENOUS
  Administered 2023-06-09: 4000 [IU] via INTRAVENOUS

## 2023-06-09 MED ORDER — MIDAZOLAM HCL 2 MG/2ML IJ SOLN
INTRAMUSCULAR | Status: DC | PRN
  Administered 2023-06-09 (×3): 1 mg via INTRAVENOUS

## 2023-06-09 MED ORDER — HEPARIN SODIUM (PORCINE) 1000 UNIT/ML IJ SOLN
INTRAMUSCULAR | Status: AC
  Filled 2023-06-09: qty 10

## 2023-06-09 MED ORDER — HEPARIN (PORCINE) 25000 UT/250ML-% IV SOLN
1300.0000 [IU]/h | INTRAVENOUS | Status: DC
  Administered 2023-06-09: 1300 [IU]/h via INTRAVENOUS
  Filled 2023-06-09: qty 250

## 2023-06-09 MED ORDER — ASPIRIN 81 MG PO CHEW
CHEWABLE_TABLET | ORAL | Status: AC
  Filled 2023-06-09: qty 3

## 2023-06-09 MED ORDER — SODIUM CHLORIDE 0.9% FLUSH: 3.0000 mL | Freq: Two times a day (BID) | INTRAVENOUS | Status: DC

## 2023-06-09 MED ORDER — ROSUVASTATIN CALCIUM 20 MG PO TABS: 40.0000 mg | ORAL_TABLET | Freq: Every day | ORAL | Status: DC

## 2023-06-09 MED ORDER — VERAPAMIL HCL 2.5 MG/ML IV SOLN
INTRAVENOUS | Status: DC | PRN
  Administered 2023-06-09: 2.5 mg via INTRA_ARTERIAL

## 2023-06-09 MED ORDER — ACETAMINOPHEN 325 MG PO TABS
650.0000 mg | ORAL_TABLET | ORAL | Status: DC | PRN
  Administered 2023-06-09: 650 mg via ORAL
  Filled 2023-06-09: qty 2

## 2023-06-09 MED ORDER — ZOLPIDEM TARTRATE 5 MG PO TABS
10.0000 mg | ORAL_TABLET | Freq: Every evening | ORAL | Status: DC | PRN
  Administered 2023-06-09: 10 mg via ORAL
  Filled 2023-06-09: qty 2

## 2023-06-09 MED ORDER — SODIUM CHLORIDE 0.9% FLUSH
3.0000 mL | Freq: Two times a day (BID) | INTRAVENOUS | Status: DC
  Administered 2023-06-10: 3 mL via INTRAVENOUS

## 2023-06-09 MED ORDER — HEPARIN (PORCINE) IN NACL 1000-0.9 UT/500ML-% IV SOLN
INTRAVENOUS | Status: DC | PRN
  Administered 2023-06-09: 1000 mL

## 2023-06-09 MED ORDER — SODIUM CHLORIDE 0.9% FLUSH: 3.0000 mL | INTRAVENOUS | Status: DC | PRN

## 2023-06-09 MED ORDER — ONDANSETRON HCL 4 MG PO TABS: 4.0000 mg | ORAL_TABLET | Freq: Three times a day (TID) | ORAL | Status: DC | PRN

## 2023-06-09 MED ORDER — AMIODARONE HCL 200 MG PO TABS
200.0000 mg | ORAL_TABLET | Freq: Every day | ORAL | Status: DC
  Administered 2023-06-10: 200 mg via ORAL
  Filled 2023-06-09: qty 1

## 2023-06-09 MED ORDER — ROSUVASTATIN CALCIUM 10 MG PO TABS
40.0000 mg | ORAL_TABLET | Freq: Every day | ORAL | Status: DC
  Administered 2023-06-09 – 2023-06-10 (×2): 40 mg via ORAL
  Filled 2023-06-09: qty 4
  Filled 2023-06-09: qty 2

## 2023-06-09 MED ORDER — IOHEXOL 300 MG/ML  SOLN
INTRAMUSCULAR | Status: DC | PRN
  Administered 2023-06-09: 180 mL

## 2023-06-09 MED ORDER — METOPROLOL SUCCINATE ER 25 MG PO TB24
25.0000 mg | ORAL_TABLET | Freq: Every day | ORAL | Status: DC
  Administered 2023-06-10: 25 mg via ORAL
  Filled 2023-06-09: qty 1

## 2023-06-09 MED ORDER — SODIUM CHLORIDE 0.9 % WEIGHT BASED INFUSION
1.0000 mL/kg/h | INTRAVENOUS | Status: AC
  Administered 2023-06-09: 1 mL/kg/h via INTRAVENOUS

## 2023-06-09 MED ORDER — LISINOPRIL 5 MG PO TABS
2.5000 mg | ORAL_TABLET | Freq: Every day | ORAL | Status: DC
  Administered 2023-06-09 – 2023-06-10 (×2): 2.5 mg via ORAL
  Filled 2023-06-09 (×2): qty 1

## 2023-06-09 MED ORDER — TICAGRELOR 90 MG PO TABS
90.0000 mg | ORAL_TABLET | Freq: Two times a day (BID) | ORAL | Status: DC
  Administered 2023-06-09 – 2023-06-10 (×2): 90 mg via ORAL
  Filled 2023-06-09 (×2): qty 1

## 2023-06-09 MED ORDER — VERAPAMIL HCL 2.5 MG/ML IV SOLN
INTRAVENOUS | Status: AC
  Filled 2023-06-09: qty 2

## 2023-06-09 SURGICAL SUPPLY — 19 items
BALLN TREK RX 2.5X15 (BALLOONS) ×1 IMPLANT
BALLN ~~LOC~~ TREK NEO RX 2.5X15 (BALLOONS) ×1 IMPLANT
BALLOON TREK RX 2.5X15 (BALLOONS) IMPLANT
BALLOON ~~LOC~~ TREK NEO RX 2.5X15 (BALLOONS) IMPLANT
CATH 5FR JL3.5 JR4 ANG PIG MP (CATHETERS) IMPLANT
CATH VISTA GUIDE 6FRXBLAD3.5SH (CATHETERS) IMPLANT
DEVICE RAD TR BAND REGULAR (VASCULAR PRODUCTS) IMPLANT
DRAPE BRACHIAL (DRAPES) IMPLANT
GLIDESHEATH SLEND SS 6F .021 (SHEATH) IMPLANT
GUIDEWIRE INQWIRE 1.5J.035X260 (WIRE) IMPLANT
INQWIRE 1.5J .035X260CM (WIRE) ×1 IMPLANT
KIT ENCORE 26 ADVANTAGE (KITS) IMPLANT
PACK CARDIAC CATH (CUSTOM PROCEDURE TRAY) ×1 IMPLANT
PROTECTION STATION PRESSURIZED (MISCELLANEOUS) ×1 IMPLANT
SET ATX-X65L (MISCELLANEOUS) IMPLANT
STATION PROTECTION PRESSURIZED (MISCELLANEOUS) IMPLANT
STENT ONYX FRONTIER 2.5X26 (Permanent Stent) IMPLANT
TUBING CIL FLEX 10 FLL-RA (TUBING) IMPLANT
WIRE G HI TQ BMW 190 (WIRE) IMPLANT

## 2023-06-09 NOTE — ED Triage Notes (Addendum)
 Pt to ED via POV from home. Pt was dx with afib on Thursday and was suppose to get admitted and get a cath but had no beds. Pt seen by Dr. Juliann Pares. Pt told by cardiologist to come to ED. Pt EKG reading aflutter. Pt placed on blood thinner Thursday but did not take it last pm due to scheduled cath this am.  RN unable to get IV access in triage - blood sent via straight stick

## 2023-06-09 NOTE — Consult Note (Addendum)
 PHARMACY - ANTICOAGULATION CONSULT NOTE  Pharmacy Consult for Heparin  Indication: chest pain/ACS and atrial fibrillation  No Known Allergies  Patient Measurements: Height: 5\' 10"  (177.8 cm) Weight: 104.3 kg (230 lb) IBW/kg (Calculated) : 73 Heparin Dosing Weight: 95.2 kg  Vital Signs: Temp: 98 F (36.7 C) (03/11 0846) BP: 123/73 (03/11 0848) Pulse Rate: 75 (03/11 0846)  Labs: Recent Labs    06/09/23 0855  HGB 15.4  HCT 49.3  PLT 272  TROPONINIHS 74*    CrCl cannot be calculated (Patient's most recent lab result is older than the maximum 21 days allowed.).   Medical History: Past Medical History:  Diagnosis Date   Hyperlipidemia    Hypertension    Sleep difficulties     Medications:  (Not in a hospital admission)  Scheduled:   sodium chloride flush  3 mL Intravenous Q12H   Infusions:  PRN: ondansetron Anti-infectives (From admission, onward)    None       Assessment: 63 yr old male presents to the ED with aflutter. Pt was diagnosed with afib on 3/6. Plan for a possible cath 3/11. Heparin consulted placed for possible ACS. Trops slightly elevated at 74. Pt was started apixaban 3/6. Will order baseline heparin level and will use aPTT for heparin monitoring until heparin level and aPTT are correlation or no false elevation in heparin level. CBC stable.    Goal of Therapy:  Heparin level 0.3-0.7 units/ml once aPTT and heparin level correlate.  aPTT 66-102 seconds Monitor platelets by anticoagulation protocol: Yes   Plan:  Give 2500 units bolus x 1, due to unknown of last dose apixaban.  Start heparin infusion at 1300 units/hr Check aPTT level in 6 hours and daily heparin level while on heparin Continue to monitor H&H and platelets  Ronnald Ramp, PharmD, BCPS 06/09/2023,10:04 AM

## 2023-06-09 NOTE — H&P (Signed)
 [facility] CLINIC CARDIOLOGY CONSULT NOTE       Patient ID: CARZELL SALDIVAR MRN: [id number] DOB/AGE: 61-Feb-1962 61 y.o.  Admit date: 06/09/2023 Referring Physician Dr. Vicente Males Primary Physician Feldpausch, Madaline Guthrie, MD  Primary Cardiologist Dr. Juliann Pares Reason for Consultation unstable angina  HPI: HERLEY BERNARDINI is a 63 y.o. male  with a past medical history of hypertension, hyperlipidemia, GERD who presented to the ED on 06/09/2023 for SOB and chest pain. Troponins mildly elevated. Cardiology was consulted for further evaluation.   Patient has been experiencing shortness of breath over the last few months.  He states that this is typically been worse with exertion.  It has been progressively worsening and he has also been experiencing associated chest tightness.  Reports that his symptoms have gotten so significant that he is unable to do his job any longer.  He underwent stress test yesterday in our clinic which was abnormal.  Patient told our office this morning that his symptoms were worsening and he was advised to come to the ED for further evaluation.  Workup in the ED notable for creatinine 1.12, potassium 4.8, hemoglobin 15.4, WBC 4.9.  Initial troponin elevated at 74.  At the time my evaluation this morning he is sitting in wheelchair with wife present at bedside.  We discussed his symptoms in further detail.  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  However he reports that over the last few months he has noticed a significant decline in his functional capacity.  Endorses now significant shortness of breath with exertion with associated chest tightness and radiation to his jaw.  He denies any episodes of syncope but endorses episodes of dizziness/lightheadedness and heart palpitations.  Denies any lower extremity edema.  Wife reports that she has noticed that he has been more pale recently.  Review of systems  complete and found to be negative unless listed above    Past Medical History:  Diagnosis Date   Hyperlipidemia    Hypertension    Sleep difficulties     History reviewed. No pertinent surgical history.  (Not in a hospital admission)  Social History   Socioeconomic History   Marital status: Married    Spouse name: Not on file   Number of children: Not on file   Years of education: Not on file   Highest education level: Not on file  Occupational History   Not on file  Tobacco Use   Smoking status: Never   Smokeless tobacco: Never  Substance and Sexual Activity   Alcohol use: Yes   Drug use: Never   Sexual activity: Not on file  Other Topics Concern   Not on file  Social History Narrative   Not on file   Social Drivers of Health   Financial Resource Strain: Low Risk  (10/30/2022)   Received from [facility]   Overall Financial Resource Strain (CARDIA)    Difficulty of Paying Living Expenses: Not very hard  Food Insecurity: Food Insecurity Present (10/30/2022)   Received from [facility]   Hunger Vital Sign    Worried About Running Out of Food in the Last Year: Sometimes true    Ran Out of Food in the Last Year: Never true  Transportation Needs: No Transportation Needs (10/30/2022)   Received from [facility] - Transportation    In the past 12 months, has lack of transportation  kept you from medical appointments or from getting medications?: No    Lack of Transportation (Non-Medical): No  Physical Activity: Not on file  Stress: Not on file  Social Connections: Not on file  Intimate Partner Violence: Not on file    Family History  Problem Relation Age of Onset   Cancer Mother    Heart disease Father    Heart disease Maternal Grandfather    Heart attack Maternal Grandfather    Heart disease Paternal Grandfather    Heart attack Paternal Grandfather      Vitals:   06/09/23 0846 06/09/23 0847 06/09/23  0848 06/09/23 1013  BP:   123/73 123/81  Pulse: 75   79  Resp: 20   18  Temp: 98 F (36.7 C)     SpO2: 97%   96%  Weight:  104.3 kg    Height:  5\' 10"  (1.778 m)      PHYSICAL EXAM General: well appearing male, well nourished, in no acute distress. HEENT: Normocephalic and atraumatic. Neck: No JVD.  Lungs: Normal respiratory effort on room air. Clear bilaterally to auscultation. No wheezes, crackles, rhonchi.  Heart: HRRR. Normal S1 and S2 without gallops or murmurs.  Abdomen: Non-distended appearing.  Msk: Normal strength and tone for age. Extremities: Warm and well perfused. No clubbing, cyanosis. no edema.  Neuro: Alert and oriented X 3. Psych: Answers questions appropriately.   Labs: Basic Metabolic Panel: Recent Labs    06/09/23 0953  NA 139  K 4.8  CL 106  CO2 24  GLUCOSE 107*  BUN 15  CREATININE 1.12  CALCIUM 9.2   Liver Function Tests: No results for input(s): "AST", "ALT", "ALKPHOS", "BILITOT", "PROT", "ALBUMIN" in the last 72 hours. No results for input(s): "LIPASE", "AMYLASE" in the last 72 hours. CBC: Recent Labs    06/09/23 0855  WBC 4.9  HGB 15.4  HCT 49.3  MCV 99.4  PLT 272   Cardiac Enzymes: Recent Labs    06/09/23 0855  TROPONINIHS 74*   BNP: No results for input(s): "BNP" in the last 72 hours. D-Dimer: No results for input(s): "DDIMER" in the last 72 hours. Hemoglobin A1C: No results for input(s): "HGBA1C" in the last 72 hours. Fasting Lipid Panel: No results for input(s): "CHOL", "HDL", "LDLCALC", "TRIG", "CHOLHDL", "LDLDIRECT" in the last 72 hours. Thyroid Function Tests: No results for input(s): "TSH", "T4TOTAL", "T3FREE", "THYROIDAB" in the last 72 hours.  Invalid input(s): "FREET3" Anemia Panel: No results for input(s): "VITAMINB12", "FOLATE", "FERRITIN", "TIBC", "IRON", "RETICCTPCT" in the last 72 hours.   Radiology: CT CARDIAC SCORING (SELF PAY ONLY) Result Date: 06/08/2023 CLINICAL DATA:  Risk stratification EXAM:  Coronary Calcium Score TECHNIQUE: The patient was scanned on a Siemens Somatom scanner. Axial non-contrast 3 mm slices were carried out through the heart. The data set was analyzed on a dedicated work station and scored using the Agatson method. FINDINGS: Non-cardiac: See separate report from [facility]. Ascending Aorta: Normal size Pericardium: Normal Coronary arteries: Normal origin of left and right coronary arteries. Distribution of arterial calcifications if present, as noted below; LM 0 LAD 8.54 LCx 42.9 RCA 0 Total 51.4 IMPRESSION AND RECOMMENDATION: 1. Coronary calcium score of 51.4. This was 57th percentile for age and sex matched control. 2. CAC 1-99 in LAD, LCx. CAC-DRS A1/N2. 3. Continue heart healthy lifestyle and risk factor modification. Electronically Signed   By: Debbe Odea M.D.   On: 06/08/2023 13:17    ECHO done yesterday in clinic with preserved EF  TELEMETRY  reviewed by me 06/09/2023: not on tele  bpm EKG reviewed by me: atrial flutter rate   Data reviewed by me 06/09/2023: last 24h vitals tele labs imaging I/O ED provider note, admission H&P  Active Problems:   * No active hospital problems. *    ASSESSMENT AND PLAN:  TORIN MODICA is a 63 y.o. male  with a past medical history of hypertension, hyperlipidemia, GERD who presented to the ED on 06/09/2023 for SOB and chest pain. Troponins mildly elevated. Cardiology was consulted for further evaluation.   # Unstable angina vs NSTEMI # Atrail flutter # Hypertension # Hyperlipidemia Patient with chest pain and SOB worsening over the last week. Plan was for direct admission and LHC but given he had worsening symptoms this AM he was advised to come to the ED. Initial troponin 74.  -Give asa 325 mg x1. Heparin gtt started.  -Will reorder home BP meds and amiodarone.  -Discussed the risks and benefits of proceeding with LHC for further evaluation with the patient.  He is agreeable to proceed.  NPO until LHC  this afternoon (06/09/23) with Dr. Juliann Pares.  Written consent will be obtained.  Further recommendations following LHC.     TIMI Risk Score for Unstable Angina or Non-ST Elevation MI:   The patient's TIMI risk score is 5, which indicates a 26% risk of all cause mortality, new or recurrent myocardial infarction or need for urgent revascularization in the next 14 days.   This patient's plan of care was discussed and created with Dr. Melton Alar and she is in agreement.  Signed: Gale Journey, PA-C  06/09/2023, 10:47 AM [facility] [place] [facility]     //: //

## 2023-06-09 NOTE — Consult Note (Deleted)
 [facility] CLINIC CARDIOLOGY CONSULT NOTE       Patient ID: CARZELL SALDIVAR MRN: [id number] DOB/AGE: 61-Feb-1962 61 y.o.  Admit date: 06/09/2023 Referring Physician Dr. Vicente Males Primary Physician Feldpausch, Madaline Guthrie, MD  Primary Cardiologist Dr. Juliann Pares Reason for Consultation unstable angina  HPI: HERLEY BERNARDINI is a 63 y.o. male  with a past medical history of hypertension, hyperlipidemia, GERD who presented to the ED on 06/09/2023 for SOB and chest pain. Troponins mildly elevated. Cardiology was consulted for further evaluation.   Patient has been experiencing shortness of breath over the last few months.  He states that this is typically been worse with exertion.  It has been progressively worsening and he has also been experiencing associated chest tightness.  Reports that his symptoms have gotten so significant that he is unable to do his job any longer.  He underwent stress test yesterday in our clinic which was abnormal.  Patient told our office this morning that his symptoms were worsening and he was advised to come to the ED for further evaluation.  Workup in the ED notable for creatinine 1.12, potassium 4.8, hemoglobin 15.4, WBC 4.9.  Initial troponin elevated at 74.  At the time my evaluation this morning he is sitting in wheelchair with wife present at bedside.  We discussed his symptoms in further detail.  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  However he reports that over the last few months he has noticed a significant decline in his functional capacity.  Endorses now significant shortness of breath with exertion with associated chest tightness and radiation to his jaw.  He denies any episodes of syncope but endorses episodes of dizziness/lightheadedness and heart palpitations.  Denies any lower extremity edema.  Wife reports that she has noticed that he has been more pale recently.  Review of systems  complete and found to be negative unless listed above    Past Medical History:  Diagnosis Date   Hyperlipidemia    Hypertension    Sleep difficulties     History reviewed. No pertinent surgical history.  (Not in a hospital admission)  Social History   Socioeconomic History   Marital status: Married    Spouse name: Not on file   Number of children: Not on file   Years of education: Not on file   Highest education level: Not on file  Occupational History   Not on file  Tobacco Use   Smoking status: Never   Smokeless tobacco: Never  Substance and Sexual Activity   Alcohol use: Yes   Drug use: Never   Sexual activity: Not on file  Other Topics Concern   Not on file  Social History Narrative   Not on file   Social Drivers of Health   Financial Resource Strain: Low Risk  (10/30/2022)   Received from [facility]   Overall Financial Resource Strain (CARDIA)    Difficulty of Paying Living Expenses: Not very hard  Food Insecurity: Food Insecurity Present (10/30/2022)   Received from [facility]   Hunger Vital Sign    Worried About Running Out of Food in the Last Year: Sometimes true    Ran Out of Food in the Last Year: Never true  Transportation Needs: No Transportation Needs (10/30/2022)   Received from [facility] - Transportation    In the past 12 months, has lack of transportation  kept you from medical appointments or from getting medications?: No    Lack of Transportation (Non-Medical): No  Physical Activity: Not on file  Stress: Not on file  Social Connections: Not on file  Intimate Partner Violence: Not on file    Family History  Problem Relation Age of Onset   Cancer Mother    Heart disease Father    Heart disease Maternal Grandfather    Heart attack Maternal Grandfather    Heart disease Paternal Grandfather    Heart attack Paternal Grandfather      Vitals:   06/09/23 0846 06/09/23 0847 06/09/23  0848 06/09/23 1013  BP:   123/73 123/81  Pulse: 75   79  Resp: 20   18  Temp: 98 F (36.7 C)     SpO2: 97%   96%  Weight:  104.3 kg    Height:  5\' 10"  (1.778 m)      PHYSICAL EXAM General: well appearing male, well nourished, in no acute distress. HEENT: Normocephalic and atraumatic. Neck: No JVD.  Lungs: Normal respiratory effort on room air. Clear bilaterally to auscultation. No wheezes, crackles, rhonchi.  Heart: HRRR. Normal S1 and S2 without gallops or murmurs.  Abdomen: Non-distended appearing.  Msk: Normal strength and tone for age. Extremities: Warm and well perfused. No clubbing, cyanosis. no edema.  Neuro: Alert and oriented X 3. Psych: Answers questions appropriately.   Labs: Basic Metabolic Panel: Recent Labs    06/09/23 0953  NA 139  K 4.8  CL 106  CO2 24  GLUCOSE 107*  BUN 15  CREATININE 1.12  CALCIUM 9.2   Liver Function Tests: No results for input(s): "AST", "ALT", "ALKPHOS", "BILITOT", "PROT", "ALBUMIN" in the last 72 hours. No results for input(s): "LIPASE", "AMYLASE" in the last 72 hours. CBC: Recent Labs    06/09/23 0855  WBC 4.9  HGB 15.4  HCT 49.3  MCV 99.4  PLT 272   Cardiac Enzymes: Recent Labs    06/09/23 0855  TROPONINIHS 74*   BNP: No results for input(s): "BNP" in the last 72 hours. D-Dimer: No results for input(s): "DDIMER" in the last 72 hours. Hemoglobin A1C: No results for input(s): "HGBA1C" in the last 72 hours. Fasting Lipid Panel: No results for input(s): "CHOL", "HDL", "LDLCALC", "TRIG", "CHOLHDL", "LDLDIRECT" in the last 72 hours. Thyroid Function Tests: No results for input(s): "TSH", "T4TOTAL", "T3FREE", "THYROIDAB" in the last 72 hours.  Invalid input(s): "FREET3" Anemia Panel: No results for input(s): "VITAMINB12", "FOLATE", "FERRITIN", "TIBC", "IRON", "RETICCTPCT" in the last 72 hours.   Radiology: CT CARDIAC SCORING (SELF PAY ONLY) Result Date: 06/08/2023 CLINICAL DATA:  Risk stratification EXAM:  Coronary Calcium Score TECHNIQUE: The patient was scanned on a Siemens Somatom scanner. Axial non-contrast 3 mm slices were carried out through the heart. The data set was analyzed on a dedicated work station and scored using the Agatson method. FINDINGS: Non-cardiac: See separate report from [facility]iology. Ascending Aorta: Normal size Pericardium: Normal Coronary arteries: Normal origin of left and right coronary arteries. Distribution of arterial calcifications if present, as noted below; LM 0 LAD 8.54 LCx 42.9 RCA 0 Total 51.4 IMPRESSION AND RECOMMENDATION: 1. Coronary calcium score of 51.4. This was 57th percentile for age and sex matched control. 2. CAC 1-99 in LAD, LCx. CAC-DRS A1/N2. 3. Continue heart healthy lifestyle and risk factor modification. Electronically Signed   By: Debbe Odea M.D.   On: 06/08/2023 13:17    ECHO done yesterday in clinic with preserved EF  TELEMETRY  reviewed by me 06/09/2023: not on tele  bpm EKG reviewed by me: atrial flutter rate   Data reviewed by me 06/09/2023: last 24h vitals tele labs imaging I/O ED provider note, admission H&P  Active Problems:   * No active hospital problems. *    ASSESSMENT AND PLAN:  TORIN MODICA is a 63 y.o. male  with a past medical history of hypertension, hyperlipidemia, GERD who presented to the ED on 06/09/2023 for SOB and chest pain. Troponins mildly elevated. Cardiology was consulted for further evaluation.   # Unstable angina vs NSTEMI # Atrail flutter # Hypertension # Hyperlipidemia Patient with chest pain and SOB worsening over the last week. Plan was for direct admission and LHC but given he had worsening symptoms this AM he was advised to come to the ED. Initial troponin 74.  -Give asa 325 mg x1. Heparin gtt started.  -Will reorder home BP meds and amiodarone.  -Discussed the risks and benefits of proceeding with LHC for further evaluation with the patient.  He is agreeable to proceed.  NPO until LHC  this afternoon (06/09/23) with Dr. Juliann Pares.  Written consent will be obtained.  Further recommendations following LHC.     TIMI Risk Score for Unstable Angina or Non-ST Elevation MI:   The patient's TIMI risk score is 5, which indicates a 26% risk of all cause mortality, new or recurrent myocardial infarction or need for urgent revascularization in the next 14 days.   This patient's plan of care was discussed and created with Dr. Melton Alar and she is in agreement.  Signed: Gale Journey, PA-C  06/09/2023, 10:47 AM [facility] [place] [facility]     //: //

## 2023-06-09 NOTE — ED Provider Notes (Signed)
 [facility] Provider Note   Event Date/Time   First MD Initiated Contact with Patient 06/09/23 [phone]     (approximate) History  Chest Pain  HPI Kurt Hammond is a 63 y.o. male with a past medical history of hypertension, hyperlipidemia, and GERD who presents complaining of worsening left chest pressure over the last week.  Patient states that it has been significantly worsened today with 7/10 left parasternal chest pressure that radiates to the left neck and jaw.  Patient also has new diagnosis of atrial flutter.  Patient was planning for a heart catheterization today with Dr. Juliann Pares was told to present to the emergency department due to worsening pain.  Patient also endorses associated shortness of breath and dyspnea on exertion with worsening of the pain on exertion as well ROS: Patient currently denies any vision changes, tinnitus, difficulty speaking, facial droop, sore throat, abdominal pain, nausea/vomiting/diarrhea, dysuria, or weakness/numbness/paresthesias in any extremity   Physical Exam  Triage Vital Signs: ED Triage Vitals  Encounter Vitals Group     BP 06/09/23 0848 123/73     Systolic BP Percentile --      Diastolic BP Percentile --      Pulse Rate 06/09/23 0846 75     Resp 06/09/23 0846 20     Temp 06/09/23 0846 98 F (36.7 C)     Temp src --      SpO2 06/09/23 0846 97 %     Weight 06/09/23 0847 230 lb (104.3 kg)     Height 06/09/23 0847 5\' 10"  (1.778 m)     Head Circumference --      Peak Flow --      Pain Score 06/09/23 0847 7     Pain Loc --      Pain Education --      Exclude from Growth Chart --    Most recent vital signs: Vitals:   06/09/23 0848 06/09/23 1013  BP: 123/73 123/81  Pulse:  79  Resp:  18  Temp:    SpO2:  96%   General: Awake, oriented x4. CV:  Good peripheral perfusion.  Resp:  Normal effort.  Abd:  No distention.  Other:  Middle-aged obese Caucasian male resting comfortably in no acute distress ED Results  / Procedures / Treatments  Labs (all labs ordered are listed, but only abnormal results are displayed) Labs Reviewed  TROPONIN I (HIGH SENSITIVITY) - Abnormal; Notable for the following components:      Result Value   Troponin I (High Sensitivity) 74 (*)    All other components within normal limits  CBC  BASIC METABOLIC PANEL  HEPARIN LEVEL (UNFRACTIONATED)  APTT  PROTIME-INR  APTT   EKG ED ECG REPORT I, Merwyn Katos, the attending physician, personally viewed and interpreted this ECG. Date: 06/09/2023 EKG Time: 0843 Rate: 74 Rhythm: Atrial flutter with variable AV block QRS Axis: normal Intervals: normal ST/T Wave abnormalities: normal Narrative Interpretation: Rate controlled atrial flutter with variable AV block no evidence of acute ischemia RADIOLOGY ED MD interpretation: 2 view chest x-ray interpreted by me shows no evidence of acute abnormalities including no pneumonia, pneumothorax, or widened mediastinum -Agree with radiology assessment Official radiology report(s): CT CARDIAC SCORING (SELF PAY ONLY) Result Date: 06/08/2023 CLINICAL DATA:  Risk stratification EXAM: Coronary Calcium Score TECHNIQUE: The patient was scanned on a Siemens Somatom scanner. Axial non-contrast 3 mm slices were carried out through the heart. The data set was analyzed on a dedicated work station  and scored using the Agatson method. FINDINGS: Non-cardiac: See separate report from [facility] Radiology. Ascending Aorta: Normal size Pericardium: Normal Coronary arteries: Normal origin of left and right coronary arteries. Distribution of arterial calcifications if present, as noted below; LM 0 LAD 8.54 LCx 42.9 RCA 0 Total 51.4 IMPRESSION AND RECOMMENDATION: 1. Coronary calcium score of 51.4. This was 57th percentile for age and sex matched control. 2. CAC 1-99 in LAD, LCx. CAC-DRS A1/N2. 3. Continue heart healthy lifestyle and risk factor modification. Electronically Signed   By: Debbe Odea M.D.    On: 06/08/2023 13:17   PROCEDURES: Critical Care performed: Yes, see critical care procedure note(s) .1-3 Lead EKG Interpretation  Performed by: Merwyn Katos, MD Authorized by: Merwyn Katos, MD     Interpretation: abnormal     ECG rate:  71   ECG rate assessment: normal     Rhythm: atrial flutter     Ectopy: none     Conduction: normal   CRITICAL CARE Performed by: Merwyn Katos  Total critical care time: 31 minutes  Critical care time was exclusive of separately billable procedures and treating other patients.  Critical care was necessary to treat or prevent imminent or life-threatening deterioration.  Critical care was time spent personally by me on the following activities: development of treatment plan with patient and/or surrogate as well as nursing, discussions with consultants, evaluation of patient's response to treatment, examination of patient, obtaining history from patient or surrogate, ordering and performing treatments and interventions, ordering and review of laboratory studies, ordering and review of radiographic studies, pulse oximetry and re-evaluation of patient's condition.  MEDICATIONS ORDERED IN ED: Medications  sodium chloride flush (NS) 0.9 % injection 3 mL (has no administration in time range)  ondansetron (ZOFRAN) tablet 4 mg (has no administration in time range)  heparin bolus via infusion 2,500 Units (has no administration in time range)  heparin ADULT infusion 100 units/mL (25000 units/219mL) (has no administration in time range)   IMPRESSION / MDM / ASSESSMENT AND PLAN / ED COURSE  I reviewed the triage vital signs and the nursing notes.                             The patient is on the cardiac monitor to evaluate for evidence of arrhythmia and/or significant heart rate changes. Patient's presentation is most consistent with acute presentation with potential threat to life or bodily function. Workup: ECG, CXR, CBC, CMP,  Troponin Findings: ECG: No overt evidence of STEMI. No evidence of Brugada's sign, delta wave, epsilon wave, significantly prolonged QTc, or malignant arrhythmia Troponin: 74 Other Labs unremarkable for emergent problems. CXR: Without PTX, PNA, or widened mediastinum Last Stress Test: Never Last Heart Catheterization: Never HEART Score: 5  Given History, Exam, and Workup concern for NSTEMI.  I have low suspicion for Pneumothorax, Pneumonia, Pulmonary Embolus, Tamponade, Aortic Dissection  Interventions: ASA 324or325mg  Heparin Bolus 60-70u/kg (max 5000) Heparin gtt about 12-15u/kg/hr (max 1000/hr) PRN analgesia with fentanyl, morphine PRN antiemetic therapy  Dispo: Admit Clinical Course as of 06/09/23 1015  Tue Jun 09, 2023  0858 Approached by PA Wilson Singer with [facility] clinic cardiology who relayed that patient is scheduled for a catheterization this afternoon however was sent to the emergency department due to worsening chest pain.  Recommends starting heparin drip and will see patient once he is in a room. [EB]    Clinical Course User Index [EB] Merwyn Katos, MD  FINAL CLINICAL IMPRESSION(S) / ED DIAGNOSES   Final diagnoses:  NSTEMI (non-ST elevated myocardial infarction) (HCC)  Chest pain due to myocardial ischemia, unspecified ischemic chest pain type  Shortness of breath  Dyspnea on exertion   Rx / DC Orders   ED Discharge Orders     None      Note:  This document was prepared using Dragon voice recognition software and may include unintentional dictation errors.   Merwyn Katos, MD 06/09/23 1016

## 2023-06-10 ENCOUNTER — Telehealth (HOSPITAL_COMMUNITY): Payer: Self-pay | Admitting: Pharmacy Technician

## 2023-06-10 ENCOUNTER — Other Ambulatory Visit: Payer: Self-pay

## 2023-06-10 ENCOUNTER — Other Ambulatory Visit (HOSPITAL_COMMUNITY): Payer: Self-pay

## 2023-06-10 ENCOUNTER — Encounter
Admission: EM | Disposition: A | Discharge status: Home / Self Care | Payer: Self-pay | Source: Home / Self Care | Attending: Internal Medicine

## 2023-06-10 HISTORY — PX: CORONARY STENT INTERVENTION: CATH118234

## 2023-06-10 LAB — CBC
HCT: 42.2 % (ref 39.0–52.0)
Hemoglobin: 14.4 g/dL (ref 13.0–17.0)
MCH: 31 pg (ref 26.0–34.0)
MCHC: 34.1 g/dL (ref 30.0–36.0)
MCV: 90.8 fL (ref 80.0–100.0)
Platelets: 268 10*3/uL (ref 150–400)
RBC: 4.65 MIL/uL (ref 4.22–5.81)
RDW: 13 % (ref 11.5–15.5)
WBC: 5.6 10*3/uL (ref 4.0–10.5)
nRBC: 0 % (ref 0.0–0.2)

## 2023-06-10 LAB — BASIC METABOLIC PANEL
Anion gap: 9 (ref 5–15)
BUN: 15 mg/dL (ref 6–20)
CO2: 21 mmol/L — ABNORMAL LOW (ref 22–32)
Calcium: 9 mg/dL (ref 8.9–10.3)
Chloride: 109 mmol/L (ref 98–111)
Creatinine, Ser: 1.01 mg/dL (ref 0.61–1.24)
GFR, Estimated: >60 mL/min (ref >60)
Glucose, Bld: 104 mg/dL — ABNORMAL HIGH (ref 70–99)
Potassium: 4 mmol/L (ref 3.5–5.1)
Sodium: 139 mmol/L (ref 135–145)

## 2023-06-10 LAB — POCT ACTIVATED CLOTTING TIME: Activated Clotting Time: 314 s

## 2023-06-10 SURGERY — CORONARY STENT INTERVENTION
Anesthesia: Moderate Sedation

## 2023-06-10 MED ORDER — MIDAZOLAM HCL 2 MG/2ML IJ SOLN
INTRAMUSCULAR | Status: DC | PRN
  Administered 2023-06-10 (×2): 1 mg via INTRAVENOUS

## 2023-06-10 MED ORDER — VERAPAMIL HCL 2.5 MG/ML IV SOLN
INTRAVENOUS | Status: AC
  Filled 2023-06-10: qty 2

## 2023-06-10 MED ORDER — TICAGRELOR 90 MG PO TABS
ORAL_TABLET | ORAL | Status: AC
  Filled 2023-06-10: qty 1

## 2023-06-10 MED ORDER — MIDAZOLAM HCL 2 MG/2ML IJ SOLN
INTRAMUSCULAR | Status: AC
  Filled 2023-06-10: qty 2

## 2023-06-10 MED ORDER — HEPARIN SODIUM (PORCINE) 1000 UNIT/ML IJ SOLN
INTRAMUSCULAR | Status: AC
  Filled 2023-06-10: qty 10

## 2023-06-10 MED ORDER — SODIUM CHLORIDE 0.9 % IV SOLN: INTRAVENOUS | Status: DC

## 2023-06-10 MED ORDER — HEPARIN (PORCINE) IN NACL 2000-0.9 UNIT/L-% IV SOLN
INTRAVENOUS | Status: DC | PRN
  Administered 2023-06-10: 1000 mL

## 2023-06-10 MED ORDER — ASPIRIN 81 MG PO CHEW: 81.0000 mg | CHEWABLE_TABLET | Freq: Every day | ORAL | Status: DC

## 2023-06-10 MED ORDER — HEPARIN SODIUM (PORCINE) 1000 UNIT/ML IJ SOLN
INTRAMUSCULAR | Status: DC | PRN
  Administered 2023-06-10: 11000 [IU] via INTRAVENOUS

## 2023-06-10 MED ORDER — HEPARIN (PORCINE) IN NACL 1000-0.9 UT/500ML-% IV SOLN
INTRAVENOUS | Status: AC
  Filled 2023-06-10: qty 1000

## 2023-06-10 MED ORDER — SODIUM CHLORIDE 0.9 % WEIGHT BASED INFUSION: 1.0000 mL/kg/h | INTRAVENOUS | Status: DC

## 2023-06-10 MED ORDER — LIDOCAINE HCL 1 % IJ SOLN
INTRAMUSCULAR | Status: AC
  Filled 2023-06-10: qty 20

## 2023-06-10 MED ORDER — FENTANYL CITRATE (PF) 100 MCG/2ML IJ SOLN
INTRAMUSCULAR | Status: DC | PRN
  Administered 2023-06-10 (×2): 25 ug via INTRAVENOUS
  Administered 2023-06-10: 50 ug via INTRAVENOUS

## 2023-06-10 MED ORDER — FENTANYL CITRATE (PF) 100 MCG/2ML IJ SOLN
INTRAMUSCULAR | Status: AC
  Filled 2023-06-10: qty 2

## 2023-06-10 MED ORDER — IOHEXOL 300 MG/ML  SOLN
INTRAMUSCULAR | Status: DC | PRN
Start: 2023-06-10 — End: 2023-06-10
  Administered 2023-06-10: 210 mL

## 2023-06-10 MED ORDER — LISINOPRIL 2.5 MG PO TABS
2.5000 mg | ORAL_TABLET | Freq: Every day | ORAL | 0 refills | Status: AC
  Filled 2023-06-10: qty 30, 30d supply, fill #0

## 2023-06-10 MED ORDER — TICAGRELOR 90 MG PO TABS
90.0000 mg | ORAL_TABLET | Freq: Two times a day (BID) | ORAL | 0 refills | Status: DC
  Filled 2023-06-10: qty 60, 30d supply, fill #0

## 2023-06-10 MED ORDER — ASPIRIN 81 MG PO CHEW
81.0000 mg | CHEWABLE_TABLET | Freq: Every day | ORAL | Status: DC
  Administered 2023-06-10: 81 mg via ORAL
  Filled 2023-06-10 (×2): qty 1

## 2023-06-10 MED ORDER — VERAPAMIL HCL 2.5 MG/ML IV SOLN
INTRAVENOUS | Status: DC | PRN
  Administered 2023-06-10: 2.5 mg via INTRAVENOUS

## 2023-06-10 MED ORDER — SODIUM CHLORIDE 0.9 % IV SOLN: Freq: Once | INTRAVENOUS | Status: AC

## 2023-06-10 MED ORDER — TICAGRELOR 90 MG PO TABS
ORAL_TABLET | ORAL | Status: DC | PRN
  Administered 2023-06-10: 90 mg via ORAL

## 2023-06-10 MED ORDER — TICAGRELOR 90 MG PO TABS
90.0000 mg | ORAL_TABLET | Freq: Two times a day (BID) | ORAL | Status: DC
Start: 2023-06-10 — End: 2023-06-10

## 2023-06-10 MED ORDER — LIDOCAINE HCL (PF) 1 % IJ SOLN
INTRAMUSCULAR | Status: DC | PRN
  Administered 2023-06-10: 2 mL

## 2023-06-10 SURGICAL SUPPLY — 16 items
BALLN TREK RX 2.5X15 (BALLOONS) ×1 IMPLANT
BALLOON TREK RX 2.5X15 (BALLOONS) IMPLANT
CATH VISTA GUIDE 6FRXBLAD3.5SH (CATHETERS) IMPLANT
DEVICE RAD TR BAND REGULAR (VASCULAR PRODUCTS) IMPLANT
DRAPE BRACHIAL (DRAPES) IMPLANT
GLIDESHEATH SLEND SS 6F .021 (SHEATH) IMPLANT
GUIDEWIRE INQWIRE 1.5J.035X260 (WIRE) IMPLANT
INQWIRE 1.5J .035X260CM (WIRE) ×1 IMPLANT
KIT ENCORE 26 ADVANTAGE (KITS) IMPLANT
PACK CARDIAC CATH (CUSTOM PROCEDURE TRAY) ×1 IMPLANT
PROTECTION STATION PRESSURIZED (MISCELLANEOUS) ×1 IMPLANT
SET ATX-X65L (MISCELLANEOUS) IMPLANT
STATION PROTECTION PRESSURIZED (MISCELLANEOUS) IMPLANT
STENT ONYX FRONTIER 2.5X30 (Permanent Stent) IMPLANT
TUBING CIL FLEX 10 FLL-RA (TUBING) IMPLANT
WIRE G HI TQ BMW 190 (WIRE) IMPLANT

## 2023-06-10 NOTE — Discharge Summary (Signed)
 Discharge Summary      Patient ID: Kurt Hammond MRN: [id number] DOB/AGE: 09-20-1960 63 y.o.  Admit date: 06/09/2023 Discharge date: 06/10/2023  Primary Discharge Diagnosis Unstable angina Secondary Discharge Diagnosis S/p coronary stent placement  Significant Diagnostic Studies: Left heart catheterization  Consults: None  Hospital Course: The patient was brought to the cardiac cath lab and underwent left heart catheterization and coronary angiography with Dr. Juliann Pares on 06/09/23. The patient received 2.5 X 26 mm frontier Onyx stent to the mid LCx. The patient tolerated with procedure well without complications. He was also noted to have lesion to mid LAD at that time but given contrast load he was set up for staged PCI the following day. He was taken back to the cath lab on the morning of 06/10/2023 for planned intervention of mid LAD lesion. The patient received 2.5 x 30 mm frontier Onyx stent to the mid LAD. On the afternoon of 06/10/2023 the right wrist access site was examined and found to be without significant erythema, tenderness to palpation, or apparent aneurysm. Hospital course was overall uneventful, on day of discharge the patient was ambulatory and eager to go home.  Discussed cardiac rehab and new prescription medications in detail. The patient was given aftercare instructions and ER return precautions. Will arrange for follow up in office in 1 week, or sooner if needed.    Patient also in atrial flutter upon admission, this was recently diagnosed and he has been put on amiodarone and a DOAC for this. Overnight on 06/09/23 after initial PCI he converted to NSR and has maintained in rhythm since this time.     Discharge Exam: Blood pressure 127/80, pulse 73, temperature 97.8 F (36.6 C), temperature source Oral, resp. rate 14, height 5\' 10"  (1.778 m), weight 104.3 kg, SpO2 97%.   General: Well appearing male, well nourished, in no acute distress.  HEENT:  Normocephalic and  atraumatic. Neck:   No JVD.  Lungs: Normal respiratory effort on room air.  Clear to ascultation bilaterally. Heart: HRRR . Normal S1 and S2 without gallops or murmurs.  Abdomen: non-distended appearing.  Msk: Normal strength and tone for age. Extremities: No peripheral edema. R radial access site without bleeding, significant tenderness to palpation, apparent aneurysm, or significant ecchymosis. Covered with clean gauze and tegaderm dressing.  Neuro: Alert and oriented x3 Psych:  calm and cooperative.   Labs:   Lab Results  Component Value Date   WBC 5.6 06/10/2023   HGB 14.4 06/10/2023   HCT 42.2 06/10/2023   MCV 90.8 06/10/2023   PLT 268 06/10/2023    Recent Labs  Lab 06/10/23 0622  NA 139  K 4.0  CL 109  CO2 21*  BUN 15  CREATININE 1.01  CALCIUM 9.0  GLUCOSE 104*      Radiology: CXR with low lung volumes and likely atelectasis EKG: Sinus rhythm rate 73 bpm  FOLLOW UP PLANS AND APPOINTMENTS Discharge Instructions     AMB Referral to Cardiac Rehabilitation - Phase II   Complete by: As directed    Diagnosis: Coronary Stents   After initial evaluation and assessments completed: Virtual Based Care may be provided alone or in conjunction with Phase 2 Cardiac Rehab based on patient barriers.: Yes   AMB Referral to Cardiac Rehabilitation - Phase II   Complete by: As directed    Diagnosis: Coronary Stents   After initial evaluation and assessments completed: Virtual Based Care may be provided alone or in conjunction with Phase 2 Cardiac  Rehab based on patient barriers.: Yes      Allergies as of 06/10/2023   No Known Allergies      Medication List     STOP taking these medications    lovastatin 20 MG tablet Commonly known as: MEVACOR   lovastatin 40 MG tablet Commonly known as: MEVACOR       TAKE these medications    amiodarone 200 MG tablet Commonly known as: PACERONE Take 200 mg by mouth daily.   apixaban 5 MG Tabs tablet Commonly known as:  ELIQUIS Take 5 mg by mouth 2 (two) times daily.   aspirin EC 81 MG tablet Take 81 mg by mouth daily. Swallow whole.   colchicine 0.6 MG tablet Take 1 tablet (0.6 mg total) by mouth 2 (two) times daily.   lisinopril 2.5 MG tablet Commonly known as: ZESTRIL Take 1 tablet (2.5 mg total) by mouth daily. Start taking on: June 11, 2023   metoprolol succinate 25 MG 24 hr tablet Commonly known as: TOPROL-XL Take 1 tablet by mouth daily.   oxyCODONE 5 MG immediate release tablet Commonly known as: Oxy IR/ROXICODONE Take 5 mg by mouth every 6 (six) hours as needed.   pantoprazole 20 MG tablet Commonly known as: PROTONIX Take 20 mg by mouth daily.   rosuvastatin 40 MG tablet Commonly known as: CRESTOR Take 40 mg by mouth daily.   ticagrelor 90 MG Tabs tablet Commonly known as: BRILINTA Take 1 tablet (90 mg total) by mouth 2 (two) times daily.   triamcinolone cream 0.5 % Commonly known as: KENALOG Apply 1 application topically 2 (two) times daily as needed (psoriasis).   Ventolin HFA 108 (90 Base) MCG/ACT inhaler Generic drug: albuterol Inhale 2 puffs into the lungs every 6 (six) hours as needed for wheezing or shortness of breath.   zolpidem 10 MG tablet Commonly known as: AMBIEN Take 10 mg by mouth at bedtime as needed for sleep.   zolpidem 6.25 MG CR tablet Commonly known as: AMBIEN CR Take 6.25 mg by mouth at bedtime as needed.        Follow-up Information     Alwyn Pea, MD. Go in 1 week(s).   Specialties: Cardiology, Internal Medicine Why: Hopsital follow up appointment: 06-17-2023 @ 9am.  Please arrive with insurance card and photo ID Contact information: [address] [place] [address] [phone]                 PLEASE BRING ALL MEDICATIONS WITH YOU TO FOLLOW UP APPOINTMENTS  Time spent with patient: 26  Signed: Gale Journey PA-C 06/10/2023, 3:05 PM [facility]

## 2023-06-10 NOTE — Telephone Encounter (Signed)
 Patient Product/process development scientist completed.    The patient is insured through [facility]. Patient has [employer], may use a copay card, and/or apply for patient assistance if available.    Ran test claim for Brilinta 90 mg and the current 30 day co-pay is $31.26.   This test claim was processed through [facility]- copay amounts may vary at other pharmacies due to pharmacy/plan contracts, or as the patient moves through the different stages of their insurance plan.     Roland Earl, CPHT Pharmacy Technician III Certified Patient Advocate [facility] Patient Advocate Team Direct Number: [phone]  Fax: [phone]

## 2023-06-10 NOTE — Progress Notes (Signed)
 Patient up occasionally throughout the evening. No C/O chest pain throughout the evening. Up in hall walking with wife.

## 2023-06-10 NOTE — Plan of Care (Signed)
  Problem: Education: Goal: Understanding of CV disease, CV risk reduction, and recovery process will improve Outcome: Progressing   Problem: Cardiovascular: Goal: Ability to achieve and maintain adequate cardiovascular perfusion will improve Outcome: Progressing   Problem: Education: Goal: Knowledge of General Education information will improve Description: Including pain rating scale, medication(s)/side effects and non-pharmacologic comfort measures Outcome: Progressing   Problem: Health Behavior/Discharge Planning: Goal: Ability to manage health-related needs will improve Outcome: Progressing   Problem: Activity: Goal: Risk for activity intolerance will decrease Outcome: Progressing

## 2023-06-10 NOTE — CV Procedure (Signed)
 Brief post PCI note  Planned intervention of mid LAD 90% lesion TIMI-3 flow  Right radial approach  Patient already on Brilinta and aspirin  XB LAD 6 French BMW wire Heparin 2.5 x 15mm  trek predilatation balloon PCI and stent 2.5 x 30 mm frontier Onyx to 16 atm lesion reduced from 90 down to 0% TIMI-3 flow maintained throughout  Patient tolerated procedure well No complications Recommend aspirin Brilinta and Eliquis for 1 month triple therapy Discontinue aspirin after 1 month and maintain Plavix Eliquis for 12 months  Plan early discharge today Follow-up with cardiology 1 to 2 weeks  Consider pulm evaluation for persistent shortness of breath  No complications  Full cath note to follow Shirlie Enck

## 2023-06-11 ENCOUNTER — Encounter: Payer: Self-pay | Admitting: Internal Medicine

## 2023-06-11 LAB — CARDIAC CATHETERIZATION: Cath EF Quantitative: 60 %

## 2023-06-11 LAB — LIPOPROTEIN A (LPA): Lipoprotein (a): <8.4 nmol/L (ref <75.0)

## 2023-06-15 ENCOUNTER — Encounter: Payer: Self-pay | Admitting: Internal Medicine

## 2023-06-18 ENCOUNTER — Encounter: Attending: Internal Medicine | Admitting: *Deleted

## 2023-06-18 ENCOUNTER — Encounter: Payer: Self-pay | Admitting: *Deleted

## 2023-06-18 DIAGNOSIS — Z48812 Encounter for surgical aftercare following surgery on the circulatory system: Secondary | ICD-10-CM | POA: Insufficient documentation

## 2023-06-18 DIAGNOSIS — Z955 Presence of coronary angioplasty implant and graft: Secondary | ICD-10-CM | POA: Insufficient documentation

## 2023-06-18 NOTE — Progress Notes (Signed)
 Virtual orientation call completed today. he has an appointment on Date: 06/22/2023  for EP eval and gym Orientation.  Documentation of diagnosis can be found in [facility] 06/09/2023 .

## 2023-06-22 ENCOUNTER — Encounter

## 2023-06-22 VITALS — Ht 70.25 in | Wt 235.1 lb

## 2023-06-22 DIAGNOSIS — Z955 Presence of coronary angioplasty implant and graft: Secondary | ICD-10-CM | POA: Diagnosis present

## 2023-06-22 DIAGNOSIS — Z48812 Encounter for surgical aftercare following surgery on the circulatory system: Secondary | ICD-10-CM | POA: Diagnosis not present

## 2023-06-22 NOTE — Progress Notes (Signed)
 Cardiac Individual Treatment Plan  Patient Details  Name: Kurt Hammond MRN: [id number] Date of Birth: July 17, 1960 Referring Provider:   Flowsheet Row Cardiac Rehab from 06/22/2023 in [facility] Cardiac and Pulmonary Rehab  Referring Provider Dr. Dorothyann Peng, MD       Initial Encounter Date:  Flowsheet Row Cardiac Rehab from 06/22/2023 in [facility] Cardiac and Pulmonary Rehab  Date 06/22/23       Visit Diagnosis: Status post coronary artery stent placement  Patient's Home Medications on Admission:  Current Outpatient Medications:    albuterol (VENTOLIN HFA) 108 (90 Base) MCG/ACT inhaler, Inhale 2 puffs into the lungs every 6 (six) hours as needed for wheezing or shortness of breath. , Disp: , Rfl:    amiodarone (PACERONE) 200 MG tablet, Take 200 mg by mouth daily. (Patient not taking: Reported on 06/18/2023), Disp: , Rfl:    apixaban (ELIQUIS) 5 MG TABS tablet, Take 5 mg by mouth 2 (two) times daily., Disp: , Rfl:    aspirin EC 81 MG tablet, Take 81 mg by mouth daily. Swallow whole., Disp: , Rfl:    clopidogrel (PLAVIX) 75 MG tablet, Take 75 mg by mouth daily., Disp: , Rfl:    colchicine 0.6 MG tablet, Take 1 tablet (0.6 mg total) by mouth 2 (two) times daily., Disp: 60 tablet, Rfl: 0   fluticasone (FLONASE) 50 MCG/ACT nasal spray, Place 2 sprays into both nostrils daily. Using as needed, Disp: , Rfl:    lisinopril (ZESTRIL) 2.5 MG tablet, Take 1 tablet (2.5 mg total) by mouth daily., Disp: 30 tablet, Rfl: 0   metoprolol succinate (TOPROL-XL) 25 MG 24 hr tablet, Take 1 tablet by mouth daily., Disp: , Rfl:    oxyCODONE (OXY IR/ROXICODONE) 5 MG immediate release tablet, Take 5 mg by mouth every 6 (six) hours as needed., Disp: , Rfl:    pantoprazole (PROTONIX) 20 MG tablet, Take 20 mg by mouth daily., Disp: , Rfl:    rosuvastatin (CRESTOR) 40 MG tablet, Take 40 mg by mouth daily., Disp: , Rfl:    tamsulosin (FLOMAX) 0.4 MG CAPS capsule, Take 0.4 mg by mouth daily., Disp: , Rfl:     triamcinolone cream (KENALOG) 0.5 %, Apply 1 application topically 2 (two) times daily as needed (psoriasis)., Disp: , Rfl:    zolpidem (AMBIEN CR) 6.25 MG CR tablet, Take 6.25 mg by mouth at bedtime as needed., Disp: , Rfl:    zolpidem (AMBIEN) 10 MG tablet, Take 10 mg by mouth at bedtime as needed for sleep. , Disp: , Rfl: 0  Past Medical History: Past Medical History:  Diagnosis Date   Hyperlipidemia    Hypertension    Sleep difficulties     Tobacco Use: Social History   Tobacco Use  Smoking Status Never  Smokeless Tobacco Never    Labs: Review Flowsheet        No data to display           Exercise Target Goals: Exercise Program Goal: Individual exercise prescription set using results from initial 6 min walk test and THRR while considering  patient's activity barriers and safety.   Exercise Prescription Goal: Initial exercise prescription builds to 30-45 minutes a day of aerobic activity, 2-3 days per week.  Home exercise guidelines will be given to patient during program as part of exercise prescription that the participant will acknowledge.   Education: Aerobic Exercise: - Group verbal and visual presentation on the components of exercise prescription. Introduces F.I.T.T principle from ACSM for exercise prescriptions.  Reviews F.I.T.T. principles of aerobic exercise including progression. Written material given at graduation.   Education: Resistance Exercise: - Group verbal and visual presentation on the components of exercise prescription. Introduces F.I.T.T principle from ACSM for exercise prescriptions  Reviews F.I.T.T. principles of resistance exercise including progression. Written material given at graduation.    Education: Exercise & Equipment Safety: - Individual verbal instruction and demonstration of equipment use and safety with use of the equipment. Flowsheet Row Cardiac Rehab from 06/22/2023 in [facility] Cardiac and Pulmonary Rehab  Date 06/22/23   Educator NT  Instruction Review Code 1- Verbalizes Understanding       Education: Exercise Physiology & General Exercise Guidelines: - Group verbal and written instruction with models to review the exercise physiology of the cardiovascular system and associated critical values. Provides general exercise guidelines with specific guidelines to those with heart or lung disease.    Education: Flexibility, Balance, Mind/Body Relaxation: - Group verbal and visual presentation with interactive activity on the components of exercise prescription. Introduces F.I.T.T principle from ACSM for exercise prescriptions. Reviews F.I.T.T. principles of flexibility and balance exercise training including progression. Also discusses the mind body connection.  Reviews various relaxation techniques to help reduce and manage stress (i.e. Deep breathing, progressive muscle relaxation, and visualization). Balance handout provided to take home. Written material given at graduation.   Activity Barriers & Risk Stratification:  Activity Barriers & Cardiac Risk Stratification - 06/22/23 1026       Activity Barriers & Cardiac Risk Stratification   Activity Barriers Shortness of Breath;Other (comment)    Comments Hx of rotator cuff surgery, degenerative joint disease    Cardiac Risk Stratification Moderate             6 Minute Walk:  6 Minute Walk     Row Name 06/22/23 1024         6 Minute Walk   Phase Initial     Distance 1410 feet     Walk Time 6 minutes     # of Rest Breaks 0     MPH 2.67     METS 3.16     RPE 9     Perceived Dyspnea  0     VO2 Peak 11.05     Symptoms No     Resting HR 65 bpm     Resting BP 106/64     Resting Oxygen Saturation  97 %     Exercise Oxygen Saturation  during 6 min walk 96 %     Max Ex. HR 91 bpm     Max Ex. BP 122/68     2 Minute Post BP 108/66              Oxygen Initial Assessment:   Oxygen Re-Evaluation:   Oxygen Discharge (Final Oxygen  Re-Evaluation):   Initial Exercise Prescription:  Initial Exercise Prescription - 06/22/23 1000       Date of Initial Exercise RX and Referring Provider   Date 06/22/23    Referring Provider Dr. Dorothyann Peng, MD      Oxygen   Maintain Oxygen Saturation 88% or higher      Treadmill   MPH 2.6    Grade 1    Minutes 15    METs 3.3      NuStep   Level 3    SPM 80    Minutes 15    METs 3.16      REL-XR   Level 3    Speed 50  Minutes 15    METs 3.16      Prescription Details   Frequency (times per week) 3    Duration Progress to 30 minutes of continuous aerobic without signs/symptoms of physical distress      Intensity   THRR 40-80% of Max Heartrate 103-141    Ratings of Perceived Exertion 11-13    Perceived Dyspnea 0-4      Progression   Progression Continue to progress workloads to maintain intensity without signs/symptoms of physical distress.      Resistance Training   Training Prescription Yes    Weight 7 lb    Reps 10-15             Perform Capillary Blood Glucose checks as needed.  Exercise Prescription Changes:   Exercise Prescription Changes     Row Name 06/22/23 1000             Response to Exercise   Blood Pressure (Admit) 106/64       Blood Pressure (Exercise) 122/68       Blood Pressure (Exit) 108/66       Heart Rate (Admit) 65 bpm       Heart Rate (Exercise) 91 bpm       Heart Rate (Exit) 64 bpm       Oxygen Saturation (Admit) 97 %       Oxygen Saturation (Exercise) 96 %       Rating of Perceived Exertion (Exercise) 9       Perceived Dyspnea (Exercise) 0       Symptoms none       Comments Results                Exercise Comments:   Exercise Goals and Review:   Exercise Goals     Row Name 06/22/23 1026             Exercise Goals   Increase Physical Activity Yes       Intervention Provide advice, education, support and counseling about physical activity/exercise needs.;Develop an individualized  exercise prescription for aerobic and resistive training based on initial evaluation findings, risk stratification, comorbidities and participant's personal goals.       Expected Outcomes Short Term: Attend rehab on a regular basis to increase amount of physical activity.;Long Term: Add in home exercise to make exercise part of routine and to increase amount of physical activity.;Long Term: Exercising regularly at least 3-5 days a week.       Increase Strength and Stamina Yes       Intervention Provide advice, education, support and counseling about physical activity/exercise needs.;Develop an individualized exercise prescription for aerobic and resistive training based on initial evaluation findings, risk stratification, comorbidities and participant's personal goals.       Expected Outcomes Short Term: Increase workloads from initial exercise prescription for resistance, speed, and METs.;Long Term: Improve cardiorespiratory fitness, muscular endurance and strength as measured by increased METs and functional capacity ( );Short Term: Perform resistance training exercises routinely during rehab and add in resistance training at home       Able to understand and use rate of perceived exertion (RPE) scale Yes       Intervention Provide education and explanation on how to use RPE scale       Expected Outcomes Short Term: Able to use RPE daily in rehab to express subjective intensity level;Long Term:  Able to use RPE to guide intensity level when exercising independently  Able to understand and use Dyspnea scale Yes       Intervention Provide education and explanation on how to use Dyspnea scale       Expected Outcomes Long Term: Able to use Dyspnea scale to guide intensity level when exercising independently;Short Term: Able to use Dyspnea scale daily in rehab to express subjective sense of shortness of breath during exertion       Knowledge and understanding of Target Heart Rate Range (THRR) Yes        Intervention Provide education and explanation of THRR including how the numbers were predicted and where they are located for reference       Expected Outcomes Short Term: Able to state/look up THRR;Long Term: Able to use THRR to govern intensity when exercising independently;Short Term: Able to use daily as guideline for intensity in rehab       Able to check pulse independently Yes       Intervention Provide education and demonstration on how to check pulse in carotid and radial arteries.;Review the importance of being able to check your own pulse for safety during independent exercise       Expected Outcomes Short Term: Able to explain why pulse checking is important during independent exercise;Long Term: Able to check pulse independently and accurately       Understanding of Exercise Prescription Yes       Intervention Provide education, explanation, and written materials on patient's individual exercise prescription       Expected Outcomes Short Term: Able to explain program exercise prescription;Long Term: Able to explain home exercise prescription to exercise independently                Exercise Goals Re-Evaluation :   Discharge Exercise Prescription (Final Exercise Prescription Changes):  Exercise Prescription Changes - 06/22/23 1000       Response to Exercise   Blood Pressure (Admit) 106/64    Blood Pressure (Exercise) 122/68    Blood Pressure (Exit) 108/66    Heart Rate (Admit) 65 bpm    Heart Rate (Exercise) 91 bpm    Heart Rate (Exit) 64 bpm    Oxygen Saturation (Admit) 97 %    Oxygen Saturation (Exercise) 96 %    Rating of Perceived Exertion (Exercise) 9    Perceived Dyspnea (Exercise) 0    Symptoms none    Comments Results             Nutrition:  Target Goals: Understanding of nutrition guidelines, daily intake of sodium 1500mg , cholesterol 200mg , calories 30% from fat and 7% or less from saturated fats, daily to have 5 or more servings of fruits  and vegetables.  Education: All About Nutrition: -Group instruction provided by verbal, written material, interactive activities, discussions, models, and posters to present general guidelines for heart healthy nutrition including fat, fiber, MyPlate, the role of sodium in heart healthy nutrition, utilization of the nutrition label, and utilization of this knowledge for meal planning. Follow up email sent as well. Written material given at graduation. Flowsheet Row Cardiac Rehab from 06/22/2023 in [facility] Cardiac and Pulmonary Rehab  Education need identified 06/22/23       Biometrics:  Pre Biometrics - 06/22/23 1030       Pre Biometrics   Height 5' 10.25" (1.784 m)    Weight 235 lb 1.6 oz (106.6 kg)    Waist Circumference 42 inches    Hip Circumference 43 inches    Waist to Hip Ratio 0.98 %  BMI (Calculated) 33.51    Single Leg Stand 30 seconds              Nutrition Therapy Plan and Nutrition Goals:   Nutrition Assessments:  MEDIFICTS Score Key: >=70 Need to make dietary changes  40-70 Heart Healthy Diet <= 40 Therapeutic Level Cholesterol Diet  Flowsheet Row Cardiac Rehab from 06/18/2023 in [facility] Cardiac and Pulmonary Rehab  Picture Your Plate Total Score on Admission 55      Picture Your Plate Scores: <82 Unhealthy dietary pattern with much room for improvement. 41-50 Dietary pattern unlikely to meet recommendations for good health and room for improvement. 51-60 More healthful dietary pattern, with some room for improvement.  >60 Healthy dietary pattern, although there may be some specific behaviors that could be improved.    Nutrition Goals Re-Evaluation:   Nutrition Goals Discharge (Final Nutrition Goals Re-Evaluation):   Psychosocial: Target Goals: Acknowledge presence or absence of significant depression and/or stress, maximize coping skills, provide positive support system. Participant is able to verbalize types and ability to use techniques and skills  needed for reducing stress and depression.   Education: Stress, Anxiety, and Depression - Group verbal and visual presentation to define topics covered.  Reviews how body is impacted by stress, anxiety, and depression.  Also discusses healthy ways to reduce stress and to treat/manage anxiety and depression.  Written material given at graduation.   Education: Sleep Hygiene -Provides group verbal and written instruction about how sleep can affect your health.  Define sleep hygiene, discuss sleep cycles and impact of sleep habits. Review good sleep hygiene tips.    Initial Review & Psychosocial Screening:  Initial Psych Review & Screening - 06/18/23 1356       Initial Review   Current issues with None Identified      Family Dynamics   Good Support System? Yes   wife, Selena Batten.     Screening Interventions   Interventions Encouraged to exercise;To provide support and resources with identified psychosocial needs;Provide feedback about the scores to participant    Expected Outcomes Short Term goal: Utilizing psychosocial counselor, staff and physician to assist with identification of specific Stressors or current issues interfering with healing process. Setting desired goal for each stressor or current issue identified.;Long Term Goal: Stressors or current issues are controlled or eliminated.;Short Term goal: Identification and review with participant of any Quality of Life or Depression concerns found by scoring the questionnaire.;Long Term goal: The participant improves quality of Life and PHQ9 Scores as seen by post scores and/or verbalization of changes             Quality of Life Scores:   Quality of Life - 06/18/23 1412       Quality of Life   Select Quality of Life      Quality of Life Scores   Health/Function Pre 23.33 %    Socioeconomic Pre 22.06 %    Psych/Spiritual Pre 24.43 %    Family Pre 30 %    GLOBAL Pre 24.21 %            Scores of 19 and below usually indicate a  poorer quality of life in these areas.  A difference of  2-3 points is a clinically meaningful difference.  A difference of 2-3 points in the total score of the Quality of Life Index has been associated with significant improvement in overall quality of life, self-image, physical symptoms, and general health in studies assessing change in quality of life.  PHQ-9: Review Flowsheet       06/22/2023  Depression screen PHQ 2/9  Decreased Interest 0  Down, Depressed, Hopeless 0  PHQ - 2 Score 0  Altered sleeping 2  Tired, decreased energy 0  Change in appetite 0  Feeling bad or failure about yourself  0  Trouble concentrating 0  Moving slowly or fidgety/restless 0  Suicidal thoughts 0  PHQ-9 Score 2  Difficult doing work/chores Not difficult at all   Interpretation of Total Score  Total Score Depression Severity:  1-4 = Minimal depression, 5-9 = Mild depression, 10-14 = Moderate depression, 15-19 = Moderately severe depression, 20-27 = Severe depression   Psychosocial Evaluation and Intervention:  Psychosocial Evaluation - 06/18/23 1403       Psychosocial Evaluation & Interventions   Interventions Encouraged to exercise with the program and follow exercise prescription    Comments No barriers to starting the program. Wants to lose weight, improve shortness of breath and learn heart healthy lifestyle steps.  He lives with his wife and she is his support.  He does have some sleep concerns that have been present for year or more. His pulmonary doctor is referring him to a Sleep doctor in [place].   He is waiting for that office to call him.   He should do well with the program    Expected Outcomes STG attends all scheduled sessions, meets with RD and follows exercise progression while in the program. Has appointment with a Sleep doctor.  LTG Continues with exercise progression, working on his weight loss and getting help with his sleep concerns after discharge    Continue Psychosocial Services   Follow up required by staff             Psychosocial Re-Evaluation:   Psychosocial Discharge (Final Psychosocial Re-Evaluation):   Vocational Rehabilitation: Provide vocational rehab assistance to qualifying candidates.   Vocational Rehab Evaluation & Intervention:  Vocational Rehab - 06/18/23 1359       Initial Vocational Rehab Evaluation & Intervention   Assessment shows need for Vocational Rehabilitation No      Vocational Rehab Re-Evaulation   Comments back at his job             Education: Education Goals: Education classes will be provided on a variety of topics geared toward better understanding of heart health and risk factor modification. Participant will state understanding/return demonstration of topics presented as noted by education test scores.  Learning Barriers/Preferences:   General Cardiac Education Topics:  AED/CPR: - Group verbal and written instruction with the use of models to demonstrate the basic use of the AED with the basic ABC's of resuscitation.   Anatomy and Cardiac Procedures: - Group verbal and visual presentation and models provide information about basic cardiac anatomy and function. Reviews the testing methods done to diagnose heart disease and the outcomes of the test results. Describes the treatment choices: Medical Management, Angioplasty, or Coronary Bypass Surgery for treating various heart conditions including Myocardial Infarction, Angina, Valve Disease, and Cardiac Arrhythmias.  Written material given at graduation. Flowsheet Row Cardiac Rehab from 06/22/2023 in [facility] Cardiac and Pulmonary Rehab  Education need identified 06/22/23       Medication Safety: - Group verbal and visual instruction to review commonly prescribed medications for heart and lung disease. Reviews the medication, class of the drug, and side effects. Includes the steps to properly store meds and maintain the prescription regimen.  Written material  given at graduation.   Intimacy: - Group  verbal instruction through game format to discuss how heart and lung disease can affect sexual intimacy. Written material given at graduation..   Know Your Numbers and Heart Failure: - Group verbal and visual instruction to discuss disease risk factors for cardiac and pulmonary disease and treatment options.  Reviews associated critical values for Overweight/Obesity, Hypertension, Cholesterol, and Diabetes.  Discusses basics of heart failure: signs/symptoms and treatments.  Introduces Heart Failure Zone chart for action plan for heart failure.  Written material given at graduation.   Infection Prevention: - Provides verbal and written material to individual with discussion of infection control including proper hand washing and proper equipment cleaning during exercise session. Flowsheet Row Cardiac Rehab from 06/22/2023 in [facility] Cardiac and Pulmonary Rehab  Date 06/22/23  Educator NT  Instruction Review Code 1- Verbalizes Understanding       Falls Prevention: - Provides verbal and written material to individual with discussion of falls prevention and safety. Flowsheet Row Cardiac Rehab from 06/22/2023 in [facility] Cardiac and Pulmonary Rehab  Date 06/18/23  Educator SB  Instruction Review Code 1- Verbalizes Understanding       Other: -Provides group and verbal instruction on various topics (see comments)   Knowledge Questionnaire Score:  Knowledge Questionnaire Score - 06/18/23 1412       Knowledge Questionnaire Score   Pre Score 23/26             Core Components/Risk Factors/Patient Goals at Admission:  Personal Goals and Risk Factors at Admission - 06/18/23 1359       Core Components/Risk Factors/Patient Goals on Admission    Weight Management Yes    Intervention Weight Management: Develop a combined nutrition and exercise program designed to reach desired caloric intake, while maintaining appropriate intake of nutrient and  fiber, sodium and fats, and appropriate energy expenditure required for the weight goal.;Weight Management/Obesity: Establish reasonable short term and long term weight goals.    Admit Weight 230 lb (104.3 kg)    Goal Weight: Short Term 228 lb (103.4 kg)    Goal Weight: Long Term 210 lb (95.3 kg)    Expected Outcomes Long Term: Adherence to nutrition and physical activity/exercise program aimed toward attainment of established weight goal;Short Term: Continue to assess and modify interventions until short term weight is achieved;Weight Loss: Understanding of general recommendations for a balanced deficit meal plan, which promotes 1-2 lb weight loss per week and includes a negative energy balance of [phone] kcal/d    Lipids Yes    Intervention Provide education and support for participant on nutrition & aerobic/resistive exercise along with prescribed medications to achieve LDL 70mg , HDL >40mg .    Expected Outcomes Short Term: Participant states understanding of desired cholesterol values and is compliant with medications prescribed. Participant is following exercise prescription and nutrition guidelines.;Long Term: Cholesterol controlled with medications as prescribed, with individualized exercise RX and with personalized nutrition plan. Value goals: LDL < 70mg , HDL > 40 mg.             Education:Diabetes - Individual verbal and written instruction to review signs/symptoms of diabetes, desired ranges of glucose level fasting, after meals and with exercise. Acknowledge that pre and post exercise glucose checks will be done for 3 sessions at entry of program.   Core Components/Risk Factors/Patient Goals Review:    Core Components/Risk Factors/Patient Goals at Discharge (Final Review):    ITP Comments:  ITP Comments     Row Name 06/18/23 1410 06/22/23 1023  ITP Comments Virtual orientation call completed today. he has an appointment on Date: 06/22/2023  for EP eval and gym  Orientation.  Documentation of diagnosis can be found in [facility] 06/09/2023 . Completed and gym orientation. Initial ITP created and sent for review to Dr. Bethann Punches, Medical Director.               Comments: Initial ITP

## 2023-06-22 NOTE — Patient Instructions (Addendum)
 Patient Instructions  Patient Details  Name: Kurt Hammond MRN: [id number] Date of Birth: 1961-01-24 Referring Provider:  Alwyn Pea, MD  Below are your personal goals for exercise, nutrition, and risk factors. Our goal is to help you stay on track towards obtaining and maintaining these goals. We will be discussing your progress on these goals with you throughout the program.  Initial Exercise Prescription:  Initial Exercise Prescription - 06/22/23 1000       Date of Initial Exercise RX and Referring Provider   Date 06/22/23    Referring Provider Dr. Dorothyann Peng, MD      Oxygen   Maintain Oxygen Saturation 88% or higher      Treadmill   MPH 2.6    Grade 1    Minutes 15    METs 3.3      NuStep   Level 3    SPM 80    Minutes 15    METs 3.16      REL-XR   Level 3    Speed 50    Minutes 15    METs 3.16      Prescription Details   Frequency (times per week) 3    Duration Progress to 30 minutes of continuous aerobic without signs/symptoms of physical distress      Intensity   THRR 40-80% of Max Heartrate 103-141    Ratings of Perceived Exertion 11-13    Perceived Dyspnea 0-4      Progression   Progression Continue to progress workloads to maintain intensity without signs/symptoms of physical distress.      Resistance Training   Training Prescription Yes    Weight 7 lb    Reps 10-15             Exercise Goals: Frequency: Be able to perform aerobic exercise two to three times per week in program working toward 2-5 days per week of home exercise.  Intensity: Work with a perceived exertion of 11 (fairly light) - 15 (hard) while following your exercise prescription.  We will make changes to your prescription with you as you progress through the program.   Duration: Be able to do 30 to 45 minutes of continuous aerobic exercise in addition to a 5 minute warm-up and a 5 minute cool-down routine.   Nutrition Goals: Your personal nutrition goals  will be established when you do your nutrition analysis with the dietician.  The following are general nutrition guidelines to follow: Cholesterol < 200mg /day Sodium < 1500mg /day Fiber: Men over 50 yrs - 30 grams per day  Personal Goals:  Personal Goals and Risk Factors at Admission - 06/18/23 1359       Core Components/Risk Factors/Patient Goals on Admission    Weight Management Yes    Intervention Weight Management: Develop a combined nutrition and exercise program designed to reach desired caloric intake, while maintaining appropriate intake of nutrient and fiber, sodium and fats, and appropriate energy expenditure required for the weight goal.;Weight Management/Obesity: Establish reasonable short term and long term weight goals.    Admit Weight 230 lb (104.3 kg)    Goal Weight: Short Term 228 lb (103.4 kg)    Goal Weight: Long Term 210 lb (95.3 kg)    Expected Outcomes Long Term: Adherence to nutrition and physical activity/exercise program aimed toward attainment of established weight goal;Short Term: Continue to assess and modify interventions until short term weight is achieved;Weight Loss: Understanding of general recommendations for a balanced deficit meal plan, which promotes 1-2  lb weight loss per week and includes a negative energy balance of [phone] kcal/d    Lipids Yes    Intervention Provide education and support for participant on nutrition & aerobic/resistive exercise along with prescribed medications to achieve LDL 70mg , HDL >40mg .    Expected Outcomes Short Term: Participant states understanding of desired cholesterol values and is compliant with medications prescribed. Participant is following exercise prescription and nutrition guidelines.;Long Term: Cholesterol controlled with medications as prescribed, with individualized exercise RX and with personalized nutrition plan. Value goals: LDL < 70mg , HDL > 40 mg.            Exercise Goals and Review:  Exercise Goals      Row Name 06/22/23 1026             Exercise Goals   Increase Physical Activity Yes       Intervention Provide advice, education, support and counseling about physical activity/exercise needs.;Develop an individualized exercise prescription for aerobic and resistive training based on initial evaluation findings, risk stratification, comorbidities and participant's personal goals.       Expected Outcomes Short Term: Attend rehab on a regular basis to increase amount of physical activity.;Long Term: Add in home exercise to make exercise part of routine and to increase amount of physical activity.;Long Term: Exercising regularly at least 3-5 days a week.       Increase Strength and Stamina Yes       Intervention Provide advice, education, support and counseling about physical activity/exercise needs.;Develop an individualized exercise prescription for aerobic and resistive training based on initial evaluation findings, risk stratification, comorbidities and participant's personal goals.       Expected Outcomes Short Term: Increase workloads from initial exercise prescription for resistance, speed, and METs.;Long Term: Improve cardiorespiratory fitness, muscular endurance and strength as measured by increased METs and functional capacity ( );Short Term: Perform resistance training exercises routinely during rehab and add in resistance training at home       Able to understand and use rate of perceived exertion (RPE) scale Yes       Intervention Provide education and explanation on how to use RPE scale       Expected Outcomes Short Term: Able to use RPE daily in rehab to express subjective intensity level;Long Term:  Able to use RPE to guide intensity level when exercising independently       Able to understand and use Dyspnea scale Yes       Intervention Provide education and explanation on how to use Dyspnea scale       Expected Outcomes Long Term: Able to use Dyspnea scale to guide intensity  level when exercising independently;Short Term: Able to use Dyspnea scale daily in rehab to express subjective sense of shortness of breath during exertion       Knowledge and understanding of Target Heart Rate Range (THRR) Yes       Intervention Provide education and explanation of THRR including how the numbers were predicted and where they are located for reference       Expected Outcomes Short Term: Able to state/look up THRR;Long Term: Able to use THRR to govern intensity when exercising independently;Short Term: Able to use daily as guideline for intensity in rehab       Able to check pulse independently Yes       Intervention Provide education and demonstration on how to check pulse in carotid and radial arteries.;Review the importance of being able to check your own pulse for safety  during independent exercise       Expected Outcomes Short Term: Able to explain why pulse checking is important during independent exercise;Long Term: Able to check pulse independently and accurately       Understanding of Exercise Prescription Yes       Intervention Provide education, explanation, and written materials on patient's individual exercise prescription       Expected Outcomes Short Term: Able to explain program exercise prescription;Long Term: Able to explain home exercise prescription to exercise independently

## 2023-06-22 NOTE — Progress Notes (Signed)
 Assessment start time: 9:57 AM  Digestive issues/concerns: no known food allergies   24-hours Recall: B: egg bacon or sausage OR Biscuit OR fruit L: leftover - OR sandwich  D: grilled chicken or fish OR pizza/hamburger Snack: celery peanut butter  Beverages water (64oz), coffee Alcohol beers 1-2 per week Caffeine coffee  Education r/t nutrition plan Patient drinking mostly water, around 64oz daily. Drinks 2 cups of coffee most mornings. He reports his Dr recommended he keep caffeine intake low. RD suggested as low as possible, but should be below 400mg  daily. Reviewed mediterranean diet handout. Educated on types of fats, sources, how to read labels. Set goal to read labels and monitor sodium intake to less than 1500mg  daily. Encouraged him to reduce his saturated fat intake and provided target of less than 15g per day. Build out a few meals and snacks with food he likes and will eat, focusing on low calorie nutrient rich foods low in sodium and saturated fat.    Goal 1: Read labels and reduce sodium intake to below 2300mg . Ideally 1500mg  per day.  Goal 2: Reduce saturated fat, less than 12g per day. Replace bad fats for more heart healthy fats.  Goal 3: Include more colorful produce, aim for 5-8 servings of fruits and veggies per day  End time 10:52 AM

## 2023-06-24 ENCOUNTER — Encounter

## 2023-06-24 DIAGNOSIS — Z955 Presence of coronary angioplasty implant and graft: Secondary | ICD-10-CM

## 2023-06-24 NOTE — Progress Notes (Addendum)
 Daily Session Note  Patient Details  Name: Kurt Hammond MRN: [id number] Date of Birth: 05/15/1960 Referring Provider:   Flowsheet Row Cardiac Rehab from 06/22/2023 in [facility] Cardiac and Pulmonary Rehab  Referring Provider Dr. Dorothyann Peng, MD       Encounter Date: 06/24/2023  Check In:  Session Check In - 06/24/23 0802       Check-In   Supervising physician immediately available to respond to emergencies See telemetry face sheet for immediately available ER MD    Location [facility]-Cardiac & Pulmonary Rehab    Staff Present Cora Collum, RN, BSN, CCRP;Joseph Hood RCP,RRT,BSRT;Noah Tickle, [place], Exercise Physiologist    Virtual Visit No    Medication changes reported     No    Fall or balance concerns reported    No    Warm-up and Cool-down Performed on first and last piece of equipment    Resistance Training Performed Yes    VAD Patient? No    PAD/SET Patient? No      Pain Assessment   Currently in Pain? No/denies                Social History   Tobacco Use  Smoking Status Never  Smokeless Tobacco Never    Goals Met:  Exercise tolerated well Personal goals reviewed No report of concerns or symptoms today  Goals Unmet:  Not Applicable  Comments: Pt able to follow exercise prescription today without complaint.  Will continue to monitor for progression. First full day of exercise!  Patient was oriented to gym and equipment including functions, settings, policies, and procedures.  Patient's individual exercise prescription and treatment plan were reviewed.  All starting workloads were established based on the results of the 6 minute walk test done at initial orientation visit.  The plan for exercise progression was also introduced and progression will be customized based on patient's performance and goals.    Dr. Bethann Punches is Medical Director for [facility] Cardiac Rehabilitation.  Dr. Vida Rigger is Medical Director for [facility] Pulmonary Rehabilitation.

## 2023-06-26 ENCOUNTER — Encounter: Admitting: *Deleted

## 2023-06-26 DIAGNOSIS — Z955 Presence of coronary angioplasty implant and graft: Secondary | ICD-10-CM

## 2023-06-26 NOTE — Progress Notes (Signed)
 Daily Session Note  Patient Details  Name: JOURDAIN GUAY MRN: [id number] Date of Birth: [birth year] Referring Provider:   Flowsheet Row Cardiac Rehab from 06/22/2023 in [facility] Cardiac and Pulmonary Rehab  Referring Provider Dr. Dorothyann Peng, MD       Encounter Date: 06/26/2023  Check In:  Session Check In - 06/26/23 0807       Check-In   Supervising physician immediately available to respond to emergencies See telemetry face sheet for immediately available ER MD    Staff Present Cora Collum, RN, BSN, CCRP;Joseph Hood RCP,RRT,BSRT;Noah Tickle, [place], Exercise Physiologist    Virtual Visit No    Medication changes reported     No    Fall or balance concerns reported    No    Warm-up and Cool-down Performed on first and last piece of equipment    Resistance Training Performed Yes    VAD Patient? No      Pain Assessment   Currently in Pain? No/denies                Social History   Tobacco Use  Smoking Status Never  Smokeless Tobacco Never    Goals Met:  Independence with exercise equipment Exercise tolerated well No report of concerns or symptoms today  Goals Unmet:  Not Applicable  Comments: Pt able to follow exercise prescription today without complaint.  Will continue to monitor for progression.    Dr. Bethann Punches is Medical Director for [facility] Cardiac Rehabilitation.  Dr. Vida Rigger is Medical Director for [facility] Pulmonary Rehabilitation.

## 2023-06-29 ENCOUNTER — Encounter: Admitting: *Deleted

## 2023-06-29 DIAGNOSIS — Z955 Presence of coronary angioplasty implant and graft: Secondary | ICD-10-CM | POA: Diagnosis not present

## 2023-06-29 NOTE — Progress Notes (Signed)
 Daily Session Note  Patient Details  Name: Kurt Hammond MRN: [id number] Date of Birth: [birth year] Referring Provider:   Flowsheet Row Cardiac Rehab from 06/22/2023 in [facility] Cardiac and Pulmonary Rehab  Referring Provider Dr. Dorothyann Peng, MD       Encounter Date: 06/29/2023  Check In:  Session Check In - 06/29/23 0925       Check-In   Supervising physician immediately available to respond to emergencies See telemetry face sheet for immediately available ER MD    Location [facility]-Cardiac & Pulmonary Rehab    Staff Present Cora Collum, RN, BSN, CCRP;Kelly Hayes BS, ACSM CEP, Exercise Physiologist;Margaret Best, MS, Exercise Physiologist;Maxon Conetta BS, Exercise Physiologist    Virtual Visit No    Medication changes reported     No    Fall or balance concerns reported    No    Warm-up and Cool-down Performed on first and last piece of equipment    Resistance Training Performed Yes    VAD Patient? No    PAD/SET Patient? No      Pain Assessment   Currently in Pain? No/denies                Social History   Tobacco Use  Smoking Status Never  Smokeless Tobacco Never    Goals Met:  Independence with exercise equipment Exercise tolerated well No report of concerns or symptoms today  Goals Unmet:  Not Applicable  Comments: Pt able to follow exercise prescription today without complaint.  Will continue to monitor for progression.    Dr. Bethann Punches is Medical Director for [facility] Cardiac Rehabilitation.  Dr. Vida Rigger is Medical Director for [facility] Pulmonary Rehabilitation.

## 2023-07-03 ENCOUNTER — Encounter: Attending: Internal Medicine | Admitting: *Deleted

## 2023-07-03 DIAGNOSIS — Z955 Presence of coronary angioplasty implant and graft: Secondary | ICD-10-CM | POA: Diagnosis present

## 2023-07-03 NOTE — Progress Notes (Signed)
 Daily Session Note  Patient Details  Name: Kurt Hammond MRN: [id number] Date of Birth: 1960-08-28 Referring Provider:   Flowsheet Row Cardiac Rehab from 06/22/2023 in [facility] Cardiac and Pulmonary Rehab  Referring Provider Dr. Dorothyann Peng, MD       Encounter Date: 07/03/2023  Check In:  Session Check In - 07/03/23 0746       Check-In   Supervising physician immediately available to respond to emergencies See telemetry face sheet for immediately available ER MD    Location [facility]-Cardiac & Pulmonary Rehab    Staff Present Cora Collum, RN, BSN, CCRP;Margaret Best, MS, Exercise Physiologist;Noah Tickle, BS, Exercise Physiologist    Virtual Visit No    Medication changes reported     No    Fall or balance concerns reported    No    Warm-up and Cool-down Performed on first and last piece of equipment    Resistance Training Performed Yes    VAD Patient? No    PAD/SET Patient? No      Pain Assessment   Currently in Pain? No/denies                Social History   Tobacco Use  Smoking Status Never  Smokeless Tobacco Never    Goals Met:  Independence with exercise equipment Exercise tolerated well No report of concerns or symptoms today  Goals Unmet:  Not Applicable  Comments: Pt able to follow exercise prescription today without complaint.  Will continue to monitor for progression.    Dr. Bethann Punches is Medical Director for [facility] Cardiac Rehabilitation.  Dr. Vida Rigger is Medical Director for [facility] Pulmonary Rehabilitation.

## 2023-07-06 ENCOUNTER — Encounter: Admitting: *Deleted

## 2023-07-06 DIAGNOSIS — Z955 Presence of coronary angioplasty implant and graft: Secondary | ICD-10-CM | POA: Diagnosis not present

## 2023-07-06 NOTE — Progress Notes (Signed)
 Daily Session Note  Patient Details  Name: Kurt Hammond MRN: [id number] Date of Birth: [birth year] Referring Provider:   Flowsheet Row Cardiac Rehab from 06/22/2023 in [facility] Cardiac and Pulmonary Rehab  Referring Provider Dr. Dorothyann Peng, MD       Encounter Date: 07/06/2023  Check In:  Session Check In - 07/06/23 0819       Check-In   Supervising physician immediately available to respond to emergencies See telemetry face sheet for immediately available ER MD    Location [facility]-Cardiac & Pulmonary Rehab    Staff Present Cora Collum, RN, BSN, CCRP;Joseph Hood RCP,RRT,BSRT;Kelly [place] BS, ACSM CEP, Exercise Physiologist;Jason Wallace Cullens RDN,LDN    Virtual Visit No    Medication changes reported     No    Fall or balance concerns reported    No    Warm-up and Cool-down Performed on first and last piece of equipment    Resistance Training Performed Yes    VAD Patient? No    PAD/SET Patient? No      Pain Assessment   Currently in Pain? No/denies                Social History   Tobacco Use  Smoking Status Never  Smokeless Tobacco Never    Goals Met:  Independence with exercise equipment Exercise tolerated well No report of concerns or symptoms today  Goals Unmet:  Not Applicable  Comments: Pt able to follow exercise prescription today without complaint.  Will continue to monitor for progression.    Dr. Bethann Punches is Medical Director for [facility] Cardiac Rehabilitation.  Dr. Vida Rigger is Medical Director for [facility] Pulmonary Rehabilitation.

## 2023-07-08 ENCOUNTER — Encounter

## 2023-07-08 ENCOUNTER — Encounter: Payer: Self-pay | Admitting: *Deleted

## 2023-07-08 DIAGNOSIS — Z955 Presence of coronary angioplasty implant and graft: Secondary | ICD-10-CM | POA: Diagnosis not present

## 2023-07-08 NOTE — Progress Notes (Signed)
 Daily Session Note  Patient Details  Name: Kurt Hammond MRN: [id number] Date of Birth: [birth year] Referring Provider:   Flowsheet Row Cardiac Rehab from 06/22/2023 in [facility] Cardiac and Pulmonary Rehab  Referring Provider Dr. Dorothyann Peng, MD       Encounter Date: 07/08/2023  Check In:  Session Check In - 07/08/23 0728       Check-In   Supervising physician immediately available to respond to emergencies See telemetry face sheet for immediately available ER MD    Location [facility]-Cardiac & Pulmonary Rehab    Staff Present Rory Percy, MS, Exercise Physiologist;Maxon Conetta BS, Exercise Physiologist;Tishawna Larouche RN,BSN,MPA;Joseph Hood RCP,RRT,BSRT    Virtual Visit No    Medication changes reported     Yes    Comments added medication to manage heart rhythm (a flutter)    Fall or balance concerns reported    No    Warm-up and Cool-down Performed on first and last piece of equipment    Resistance Training Performed Yes    VAD Patient? No    PAD/SET Patient? No      Pain Assessment   Currently in Pain? No/denies                Social History   Tobacco Use  Smoking Status Never  Smokeless Tobacco Never    Goals Met:  Independence with exercise equipment Exercise tolerated well No report of concerns or symptoms today Strength training completed today  Goals Unmet:  Not Applicable  Comments: Pt able to follow exercise prescription today without complaint.  Will continue to monitor for progression.    Dr. Bethann Punches is Medical Director for [facility] Cardiac Rehabilitation.  Dr. Vida Rigger is Medical Director for [facility] Pulmonary Rehabilitation.

## 2023-07-08 NOTE — Progress Notes (Signed)
 Cardiac Individual Treatment Plan  Patient Details  Name: Kurt Hammond MRN: [id number] Date of Birth: 14-Apr-1960 Referring Provider:   Flowsheet Row Cardiac Rehab from 06/22/2023 in [facility] Cardiac and Pulmonary Rehab  Referring Provider Dr. Dorothyann Peng, MD       Initial Encounter Date:  Flowsheet Row Cardiac Rehab from 06/22/2023 in [facility] Cardiac and Pulmonary Rehab  Date 06/22/23       Visit Diagnosis: Status post coronary artery stent placement  Patient's Home Medications on Admission:  Current Outpatient Medications:    albuterol (VENTOLIN HFA) 108 (90 Base) MCG/ACT inhaler, Inhale 2 puffs into the lungs every 6 (six) hours as needed for wheezing or shortness of breath. , Disp: , Rfl:    amiodarone (PACERONE) 200 MG tablet, Take 200 mg by mouth daily. (Patient not taking: Reported on 06/18/2023), Disp: , Rfl:    apixaban (ELIQUIS) 5 MG TABS tablet, Take 5 mg by mouth 2 (two) times daily., Disp: , Rfl:    aspirin EC 81 MG tablet, Take 81 mg by mouth daily. Swallow whole., Disp: , Rfl:    clopidogrel (PLAVIX) 75 MG tablet, Take 75 mg by mouth daily., Disp: , Rfl:    colchicine 0.6 MG tablet, Take 1 tablet (0.6 mg total) by mouth 2 (two) times daily., Disp: 60 tablet, Rfl: 0   fluticasone (FLONASE) 50 MCG/ACT nasal spray, Place 2 sprays into both nostrils daily. Using as needed, Disp: , Rfl:    lisinopril (ZESTRIL) 2.5 MG tablet, Take 1 tablet (2.5 mg total) by mouth daily., Disp: 30 tablet, Rfl: 0   metoprolol succinate (TOPROL-XL) 25 MG 24 hr tablet, Take 1 tablet by mouth daily., Disp: , Rfl:    oxyCODONE (OXY IR/ROXICODONE) 5 MG immediate release tablet, Take 5 mg by mouth every 6 (six) hours as needed., Disp: , Rfl:    pantoprazole (PROTONIX) 20 MG tablet, Take 20 mg by mouth daily., Disp: , Rfl:    rosuvastatin (CRESTOR) 40 MG tablet, Take 40 mg by mouth daily., Disp: , Rfl:    tamsulosin (FLOMAX) 0.4 MG CAPS capsule, Take 0.4 mg by mouth daily., Disp: , Rfl:     triamcinolone cream (KENALOG) 0.5 %, Apply 1 application topically 2 (two) times daily as needed (psoriasis)., Disp: , Rfl:    zolpidem (AMBIEN CR) 6.25 MG CR tablet, Take 6.25 mg by mouth at bedtime as needed., Disp: , Rfl:    zolpidem (AMBIEN) 10 MG tablet, Take 10 mg by mouth at bedtime as needed for sleep. , Disp: , Rfl: 0  Past Medical History: Past Medical History:  Diagnosis Date   Hyperlipidemia    Hypertension    Sleep difficulties     Tobacco Use: Social History   Tobacco Use  Smoking Status Never  Smokeless Tobacco Never    Labs: Review Flowsheet        No data to display           Exercise Target Goals: Exercise Program Goal: Individual exercise prescription set using results from initial 6 min walk test and THRR while considering  patient's activity barriers and safety.   Exercise Prescription Goal: Initial exercise prescription builds to 30-45 minutes a day of aerobic activity, 2-3 days per week.  Home exercise guidelines will be given to patient during program as part of exercise prescription that the participant will acknowledge.   Education: Aerobic Exercise: - Group verbal and visual presentation on the components of exercise prescription. Introduces F.I.T.T principle from ACSM for exercise prescriptions.  Reviews F.I.T.T. principles of aerobic exercise including progression. Written material given at graduation.   Education: Resistance Exercise: - Group verbal and visual presentation on the components of exercise prescription. Introduces F.I.T.T principle from ACSM for exercise prescriptions  Reviews F.I.T.T. principles of resistance exercise including progression. Written material given at graduation.    Education: Exercise & Equipment Safety: - Individual verbal instruction and demonstration of equipment use and safety with use of the equipment. Flowsheet Row Cardiac Rehab from 07/08/2023 in [facility] Cardiac and Pulmonary Rehab  Date 06/22/23   Educator NT  Instruction Review Code 1- Verbalizes Understanding       Education: Exercise Physiology & General Exercise Guidelines: - Group verbal and written instruction with models to review the exercise physiology of the cardiovascular system and associated critical values. Provides general exercise guidelines with specific guidelines to those with heart or lung disease.    Education: Flexibility, Balance, Mind/Body Relaxation: - Group verbal and visual presentation with interactive activity on the components of exercise prescription. Introduces F.I.T.T principle from ACSM for exercise prescriptions. Reviews F.I.T.T. principles of flexibility and balance exercise training including progression. Also discusses the mind body connection.  Reviews various relaxation techniques to help reduce and manage stress (i.e. Deep breathing, progressive muscle relaxation, and visualization). Balance handout provided to take home. Written material given at graduation.   Activity Barriers & Risk Stratification:  Activity Barriers & Cardiac Risk Stratification - 06/22/23 1026       Activity Barriers & Cardiac Risk Stratification   Activity Barriers Shortness of Breath;Other (comment)    Comments Hx of rotator cuff surgery, degenerative joint disease    Cardiac Risk Stratification Moderate             6 Minute Walk:  6 Minute Walk     Row Name 06/22/23 1024         6 Minute Walk   Phase Initial     Distance 1410 feet     Walk Time 6 minutes     # of Rest Breaks 0     MPH 2.67     METS 3.16     RPE 9     Perceived Dyspnea  0     VO2 Peak 11.05     Symptoms No     Resting HR 65 bpm     Resting BP 106/64     Resting Oxygen Saturation  97 %     Exercise Oxygen Saturation  during 6 min walk 96 %     Max Ex. HR 91 bpm     Max Ex. BP 122/68     2 Minute Post BP 108/66              Oxygen Initial Assessment:   Oxygen Re-Evaluation:   Oxygen Discharge (Final Oxygen  Re-Evaluation):   Initial Exercise Prescription:  Initial Exercise Prescription - 06/22/23 1000       Date of Initial Exercise RX and Referring Provider   Date 06/22/23    Referring Provider Dr. Dorothyann Peng, MD      Oxygen   Maintain Oxygen Saturation 88% or higher      Treadmill   MPH 2.6    Grade 1    Minutes 15    METs 3.3      NuStep   Level 3    SPM 80    Minutes 15    METs 3.16      REL-XR   Level 3    Speed 50  Minutes 15    METs 3.16      Prescription Details   Frequency (times per week) 3    Duration Progress to 30 minutes of continuous aerobic without signs/symptoms of physical distress      Intensity   THRR 40-80% of Max Heartrate 103-141    Ratings of Perceived Exertion 11-13    Perceived Dyspnea 0-4      Progression   Progression Continue to progress workloads to maintain intensity without signs/symptoms of physical distress.      Resistance Training   Training Prescription Yes    Weight 7 lb    Reps 10-15             Perform Capillary Blood Glucose checks as needed.  Exercise Prescription Changes:   Exercise Prescription Changes     Row Name 06/22/23 1000             Response to Exercise   Blood Pressure (Admit) 106/64       Blood Pressure (Exercise) 122/68       Blood Pressure (Exit) 108/66       Heart Rate (Admit) 65 bpm       Heart Rate (Exercise) 91 bpm       Heart Rate (Exit) 64 bpm       Oxygen Saturation (Admit) 97 %       Oxygen Saturation (Exercise) 96 %       Rating of Perceived Exertion (Exercise) 9       Perceived Dyspnea (Exercise) 0       Symptoms none       Comments Results                Exercise Comments:   Exercise Comments     Row Name 06/24/23 0830           Exercise Comments First full day of exercise!  Patient was oriented to gym and equipment including functions, settings, policies, and procedures.  Patient's individual exercise prescription and treatment plan were reviewed.   All starting workloads were established based on the results of the 6 minute walk test done at initial orientation visit.  The plan for exercise progression was also introduced and progression will be customized based on patient's performance and goals.                Exercise Goals and Review:   Exercise Goals     Row Name 06/22/23 1026             Exercise Goals   Increase Physical Activity Yes       Intervention Provide advice, education, support and counseling about physical activity/exercise needs.;Develop an individualized exercise prescription for aerobic and resistive training based on initial evaluation findings, risk stratification, comorbidities and participant's personal goals.       Expected Outcomes Short Term: Attend rehab on a regular basis to increase amount of physical activity.;Long Term: Add in home exercise to make exercise part of routine and to increase amount of physical activity.;Long Term: Exercising regularly at least 3-5 days a week.       Increase Strength and Stamina Yes       Intervention Provide advice, education, support and counseling about physical activity/exercise needs.;Develop an individualized exercise prescription for aerobic and resistive training based on initial evaluation findings, risk stratification, comorbidities and participant's personal goals.       Expected Outcomes Short Term: Increase workloads from initial exercise prescription for resistance, speed, and METs.;Long  Term: Improve cardiorespiratory fitness, muscular endurance and strength as measured by increased METs and functional capacity ( );Short Term: Perform resistance training exercises routinely during rehab and add in resistance training at home       Able to understand and use rate of perceived exertion (RPE) scale Yes       Intervention Provide education and explanation on how to use RPE scale       Expected Outcomes Short Term: Able to use RPE daily in rehab to express  subjective intensity level;Long Term:  Able to use RPE to guide intensity level when exercising independently       Able to understand and use Dyspnea scale Yes       Intervention Provide education and explanation on how to use Dyspnea scale       Expected Outcomes Long Term: Able to use Dyspnea scale to guide intensity level when exercising independently;Short Term: Able to use Dyspnea scale daily in rehab to express subjective sense of shortness of breath during exertion       Knowledge and understanding of Target Heart Rate Range (THRR) Yes       Intervention Provide education and explanation of THRR including how the numbers were predicted and where they are located for reference       Expected Outcomes Short Term: Able to state/look up THRR;Long Term: Able to use THRR to govern intensity when exercising independently;Short Term: Able to use daily as guideline for intensity in rehab       Able to check pulse independently Yes       Intervention Provide education and demonstration on how to check pulse in carotid and radial arteries.;Review the importance of being able to check your own pulse for safety during independent exercise       Expected Outcomes Short Term: Able to explain why pulse checking is important during independent exercise;Long Term: Able to check pulse independently and accurately       Understanding of Exercise Prescription Yes       Intervention Provide education, explanation, and written materials on patient's individual exercise prescription       Expected Outcomes Short Term: Able to explain program exercise prescription;Long Term: Able to explain home exercise prescription to exercise independently                Exercise Goals Re-Evaluation :  Exercise Goals Re-Evaluation     Row Name 06/24/23 0830             Exercise Goal Re-Evaluation   Exercise Goals Review Able to understand and use rate of perceived exertion (RPE) scale;Able to understand and use  Dyspnea scale;Knowledge and understanding of Target Heart Rate Range (THRR);Understanding of Exercise Prescription       Comments Reviewed RPE and dyspnea scale, THR and program prescription with pt today.  Pt voiced understanding and was given a copy of goals to take home.       Expected Outcomes Short: Use RPE daily to regulate intensity. Long: Follow program prescription in THR.                Discharge Exercise Prescription (Final Exercise Prescription Changes):  Exercise Prescription Changes - 06/22/23 1000       Response to Exercise   Blood Pressure (Admit) 106/64    Blood Pressure (Exercise) 122/68    Blood Pressure (Exit) 108/66    Heart Rate (Admit) 65 bpm    Heart Rate (Exercise) 91 bpm    Heart Rate (Exit)  64 bpm    Oxygen Saturation (Admit) 97 %    Oxygen Saturation (Exercise) 96 %    Rating of Perceived Exertion (Exercise) 9    Perceived Dyspnea (Exercise) 0    Symptoms none    Comments Results             Nutrition:  Target Goals: Understanding of nutrition guidelines, daily intake of sodium 1500mg , cholesterol 200mg , calories 30% from fat and 7% or less from saturated fats, daily to have 5 or more servings of fruits and vegetables.  Education: All About Nutrition: -Group instruction provided by verbal, written material, interactive activities, discussions, models, and posters to present general guidelines for heart healthy nutrition including fat, fiber, MyPlate, the role of sodium in heart healthy nutrition, utilization of the nutrition label, and utilization of this knowledge for meal planning. Follow up email sent as well. Written material given at graduation. Flowsheet Row Cardiac Rehab from 07/08/2023 in [facility] Cardiac and Pulmonary Rehab  Education need identified 06/22/23       Biometrics:  Pre Biometrics - 06/22/23 1030       Pre Biometrics   Height 5' 10.25" (1.784 m)    Weight 235 lb 1.6 oz (106.6 kg)    Waist Circumference 42 inches     Hip Circumference 43 inches    Waist to Hip Ratio 0.98 %    BMI (Calculated) 33.51    Single Leg Stand 30 seconds              Nutrition Therapy Plan and Nutrition Goals:  Nutrition Therapy & Goals - 06/22/23 1532       Nutrition Therapy   Diet Cardiac, Low Na    Protein (specify units) 90    Fiber 30 grams    Whole Grain Foods 3 servings    Saturated Fats 15 max. grams    Fruits and Vegetables 5 servings/day    Sodium 2 grams      Personal Nutrition Goals   Nutrition Goal Read labels and reduce sodium intake to below 2300mg . Ideally 1500mg  per day.    Personal Goal #2 Reduce saturated fat, less than 12g per day. Replace bad fats for more heart healthy fats.    Personal Goal #3 Include more colorful produce, aim for 5-8 servings of fruits and veggies per day    Comments Patient drinking mostly water, around 64oz daily. Drinks 2 cups of coffee most mornings. He reports his Dr recommended he keep caffeine intake low. RD suggested as low as possible, but should be below 400mg  daily. Reviewed mediterranean diet handout. Educated on types of fats, sources, how to read labels. Set goal to read labels and monitor sodium intake to less than 1500mg  daily. Encouraged him to reduce his saturated fat intake and provided target of less than 15g per day. Build out a few meals and snacks with food he likes and will eat, focusing on low calorie nutrient rich foods low in sodium and saturated fat.      Intervention Plan   Intervention Prescribe, educate and counsel regarding individualized specific dietary modifications aiming towards targeted core components such as weight, hypertension, lipid management, diabetes, heart failure and other comorbidities.;Nutrition handout(s) given to patient.    Expected Outcomes Short Term Goal: Understand basic principles of dietary content, such as calories, fat, sodium, cholesterol and nutrients.;Short Term Goal: A plan has been developed with personal nutrition  goals set during dietitian appointment.;Long Term Goal: Adherence to prescribed nutrition plan.  Nutrition Assessments:  MEDIFICTS Score Key: >=70 Need to make dietary changes  40-70 Heart Healthy Diet <= 40 Therapeutic Level Cholesterol Diet  Flowsheet Row Cardiac Rehab from 06/18/2023 in [facility] Cardiac and Pulmonary Rehab  Picture Your Plate Total Score on Admission 55      Picture Your Plate Scores: <60 Unhealthy dietary pattern with much room for improvement. 41-50 Dietary pattern unlikely to meet recommendations for good health and room for improvement. 51-60 More healthful dietary pattern, with some room for improvement.  >60 Healthy dietary pattern, although there may be some specific behaviors that could be improved.    Nutrition Goals Re-Evaluation:   Nutrition Goals Discharge (Final Nutrition Goals Re-Evaluation):   Psychosocial: Target Goals: Acknowledge presence or absence of significant depression and/or stress, maximize coping skills, provide positive support system. Participant is able to verbalize types and ability to use techniques and skills needed for reducing stress and depression.   Education: Stress, Anxiety, and Depression - Group verbal and visual presentation to define topics covered.  Reviews how body is impacted by stress, anxiety, and depression.  Also discusses healthy ways to reduce stress and to treat/manage anxiety and depression.  Written material given at graduation.   Education: Sleep Hygiene -Provides group verbal and written instruction about how sleep can affect your health.  Define sleep hygiene, discuss sleep cycles and impact of sleep habits. Review good sleep hygiene tips.    Initial Review & Psychosocial Screening:  Initial Psych Review & Screening - 06/18/23 1356       Initial Review   Current issues with None Identified      Family Dynamics   Good Support System? Yes   wife, Selena Batten.     Screening Interventions    Interventions Encouraged to exercise;To provide support and resources with identified psychosocial needs;Provide feedback about the scores to participant    Expected Outcomes Short Term goal: Utilizing psychosocial counselor, staff and physician to assist with identification of specific Stressors or current issues interfering with healing process. Setting desired goal for each stressor or current issue identified.;Long Term Goal: Stressors or current issues are controlled or eliminated.;Short Term goal: Identification and review with participant of any Quality of Life or Depression concerns found by scoring the questionnaire.;Long Term goal: The participant improves quality of Life and PHQ9 Scores as seen by post scores and/or verbalization of changes             Quality of Life Scores:   Quality of Life - 06/18/23 1412       Quality of Life   Select Quality of Life      Quality of Life Scores   Health/Function Pre 23.33 %    Socioeconomic Pre 22.06 %    Psych/Spiritual Pre 24.43 %    Family Pre 30 %    GLOBAL Pre 24.21 %            Scores of 19 and below usually indicate a poorer quality of life in these areas.  A difference of  2-3 points is a clinically meaningful difference.  A difference of 2-3 points in the total score of the Quality of Life Index has been associated with significant improvement in overall quality of life, self-image, physical symptoms, and general health in studies assessing change in quality of life.  PHQ-9: Review Flowsheet       06/22/2023  Depression screen PHQ 2/9  Decreased Interest 0  Down, Depressed, Hopeless 0  PHQ - 2 Score 0  Altered sleeping  2  Tired, decreased energy 0  Change in appetite 0  Feeling bad or failure about yourself  0  Trouble concentrating 0  Moving slowly or fidgety/restless 0  Suicidal thoughts 0  PHQ-9 Score 2  Difficult doing work/chores Not difficult at all   Interpretation of Total Score  Total Score Depression  Severity:  1-4 = Minimal depression, 5-9 = Mild depression, 10-14 = Moderate depression, 15-19 = Moderately severe depression, 20-27 = Severe depression   Psychosocial Evaluation and Intervention:  Psychosocial Evaluation - 06/18/23 1403       Psychosocial Evaluation & Interventions   Interventions Encouraged to exercise with the program and follow exercise prescription    Comments No barriers to starting the program. Wants to lose weight, improve shortness of breath and learn heart healthy lifestyle steps.  He lives with his wife and she is his support.  He does have some sleep concerns that have been present for year or more. His pulmonary doctor is referring him to a Sleep doctor in [place].   He is waiting for that office to call him.   He should do well with the program    Expected Outcomes STG attends all scheduled sessions, meets with RD and follows exercise progression while in the program. Has appointment with a Sleep doctor.  LTG Continues with exercise progression, working on his weight loss and getting help with his sleep concerns after discharge    Continue Psychosocial Services  Follow up required by staff             Psychosocial Re-Evaluation:   Psychosocial Discharge (Final Psychosocial Re-Evaluation):   Vocational Rehabilitation: Provide vocational rehab assistance to qualifying candidates.   Vocational Rehab Evaluation & Intervention:  Vocational Rehab - 06/18/23 1359       Initial Vocational Rehab Evaluation & Intervention   Assessment shows need for Vocational Rehabilitation No      Vocational Rehab Re-Evaulation   Comments back at his job             Education: Education Goals: Education classes will be provided on a variety of topics geared toward better understanding of heart health and risk factor modification. Participant will state understanding/return demonstration of topics presented as noted by education test scores.  Learning  Barriers/Preferences:   General Cardiac Education Topics:  AED/CPR: - Group verbal and written instruction with the use of models to demonstrate the basic use of the AED with the basic ABC's of resuscitation.   Anatomy and Cardiac Procedures: - Group verbal and visual presentation and models provide information about basic cardiac anatomy and function. Reviews the testing methods done to diagnose heart disease and the outcomes of the test results. Describes the treatment choices: Medical Management, Angioplasty, or Coronary Bypass Surgery for treating various heart conditions including Myocardial Infarction, Angina, Valve Disease, and Cardiac Arrhythmias.  Written material given at graduation. Flowsheet Row Cardiac Rehab from 07/08/2023 in [facility] Cardiac and Pulmonary Rehab  Education need identified 06/22/23  Date 07/08/23  Educator sb  Instruction Review Code 1- Verbalizes Understanding       Medication Safety: - Group verbal and visual instruction to review commonly prescribed medications for heart and lung disease. Reviews the medication, class of the drug, and side effects. Includes the steps to properly store meds and maintain the prescription regimen.  Written material given at graduation.   Intimacy: - Group verbal instruction through game format to discuss how heart and lung disease can affect sexual intimacy. Written material given at  graduation..   Know Your Numbers and Heart Failure: - Group verbal and visual instruction to discuss disease risk factors for cardiac and pulmonary disease and treatment options.  Reviews associated critical values for Overweight/Obesity, Hypertension, Cholesterol, and Diabetes.  Discusses basics of heart failure: signs/symptoms and treatments.  Introduces Heart Failure Zone chart for action plan for heart failure.  Written material given at graduation.   Infection Prevention: - Provides verbal and written material to individual with discussion of  infection control including proper hand washing and proper equipment cleaning during exercise session. Flowsheet Row Cardiac Rehab from 07/08/2023 in [facility] Cardiac and Pulmonary Rehab  Date 06/22/23  Educator NT  Instruction Review Code 1- Verbalizes Understanding       Falls Prevention: - Provides verbal and written material to individual with discussion of falls prevention and safety. Flowsheet Row Cardiac Rehab from 07/08/2023 in [facility] Cardiac and Pulmonary Rehab  Date 06/18/23  Educator SB  Instruction Review Code 1- Verbalizes Understanding       Other: -Provides group and verbal instruction on various topics (see comments)   Knowledge Questionnaire Score:  Knowledge Questionnaire Score - 06/18/23 1412       Knowledge Questionnaire Score   Pre Score 23/26             Core Components/Risk Factors/Patient Goals at Admission:  Personal Goals and Risk Factors at Admission - 06/18/23 1359       Core Components/Risk Factors/Patient Goals on Admission    Weight Management Yes    Intervention Weight Management: Develop a combined nutrition and exercise program designed to reach desired caloric intake, while maintaining appropriate intake of nutrient and fiber, sodium and fats, and appropriate energy expenditure required for the weight goal.;Weight Management/Obesity: Establish reasonable short term and long term weight goals.    Admit Weight 230 lb (104.3 kg)    Goal Weight: Short Term 228 lb (103.4 kg)    Goal Weight: Long Term 210 lb (95.3 kg)    Expected Outcomes Long Term: Adherence to nutrition and physical activity/exercise program aimed toward attainment of established weight goal;Short Term: Continue to assess and modify interventions until short term weight is achieved;Weight Loss: Understanding of general recommendations for a balanced deficit meal plan, which promotes 1-2 lb weight loss per week and includes a negative energy balance of [phone] kcal/d    Lipids Yes     Intervention Provide education and support for participant on nutrition & aerobic/resistive exercise along with prescribed medications to achieve LDL 70mg , HDL >40mg .    Expected Outcomes Short Term: Participant states understanding of desired cholesterol values and is compliant with medications prescribed. Participant is following exercise prescription and nutrition guidelines.;Long Term: Cholesterol controlled with medications as prescribed, with individualized exercise RX and with personalized nutrition plan. Value goals: LDL < 70mg , HDL > 40 mg.             Education:Diabetes - Individual verbal and written instruction to review signs/symptoms of diabetes, desired ranges of glucose level fasting, after meals and with exercise. Acknowledge that pre and post exercise glucose checks will be done for 3 sessions at entry of program.   Core Components/Risk Factors/Patient Goals Review:    Core Components/Risk Factors/Patient Goals at Discharge (Final Review):    ITP Comments:  ITP Comments     Row Name 06/18/23 1410 06/22/23 1023 06/24/23 0830 07/08/23 1159     ITP Comments Virtual orientation call completed today. he has an appointment on Date: 06/22/2023  for EP eval and  gym Orientation.  Documentation of diagnosis can be found in [facility] 06/09/2023 . Completed and gym orientation. Initial ITP created and sent for review to Dr. Bethann Punches, Medical Director. First full day of exercise!  Patient was oriented to gym and equipment including functions, settings, policies, and procedures.  Patient's individual exercise prescription and treatment plan were reviewed.  All starting workloads were established based on the results of the 6 minute walk test done at initial orientation visit.  The plan for exercise progression was also introduced and progression will be customized based on patient's performance and goals. 30 Day review completed. Medical Director ITP review done, changes made as  directed, and signed approval by Medical Director.    new to program             Comments:

## 2023-07-08 NOTE — Progress Notes (Signed)
 Cardiac Individual Treatment Plan  Patient Details  Name: Kurt Hammond MRN: [id number] Date of Birth: [birth year] Referring Provider:   Flowsheet Row Cardiac Rehab from 06/22/2023 in [facility] Cardiac and Pulmonary Rehab  Referring Provider Dr. Dorothyann Peng, MD       Initial Encounter Date:  Flowsheet Row Cardiac Rehab from 06/22/2023 in [facility] Cardiac and Pulmonary Rehab  Date 06/22/23       Visit Diagnosis: Status post coronary artery stent placement  Patient's Home Medications on Admission:  Current Outpatient Medications:    albuterol (VENTOLIN HFA) 108 (90 Base) MCG/ACT inhaler, Inhale 2 puffs into the lungs every 6 (six) hours as needed for wheezing or shortness of breath. , Disp: , Rfl:    amiodarone (PACERONE) 200 MG tablet, Take 200 mg by mouth daily. (Patient not taking: Reported on 06/18/2023), Disp: , Rfl:    apixaban (ELIQUIS) 5 MG TABS tablet, Take 5 mg by mouth 2 (two) times daily., Disp: , Rfl:    aspirin EC 81 MG tablet, Take 81 mg by mouth daily. Swallow whole., Disp: , Rfl:    clopidogrel (PLAVIX) 75 MG tablet, Take 75 mg by mouth daily., Disp: , Rfl:    colchicine 0.6 MG tablet, Take 1 tablet (0.6 mg total) by mouth 2 (two) times daily., Disp: 60 tablet, Rfl: 0   fluticasone (FLONASE) 50 MCG/ACT nasal spray, Place 2 sprays into both nostrils daily. Using as needed, Disp: , Rfl:    lisinopril (ZESTRIL) 2.5 MG tablet, Take 1 tablet (2.5 mg total) by mouth daily., Disp: 30 tablet, Rfl: 0   metoprolol succinate (TOPROL-XL) 25 MG 24 hr tablet, Take 1 tablet by mouth daily., Disp: , Rfl:    oxyCODONE (OXY IR/ROXICODONE) 5 MG immediate release tablet, Take 5 mg by mouth every 6 (six) hours as needed., Disp: , Rfl:    pantoprazole (PROTONIX) 20 MG tablet, Take 20 mg by mouth daily., Disp: , Rfl:    rosuvastatin (CRESTOR) 40 MG tablet, Take 40 mg by mouth daily., Disp: , Rfl:    tamsulosin (FLOMAX) 0.4 MG CAPS capsule, Take 0.4 mg by mouth daily., Disp: , Rfl:     triamcinolone cream (KENALOG) 0.5 %, Apply 1 application topically 2 (two) times daily as needed (psoriasis)., Disp: , Rfl:    zolpidem (AMBIEN CR) 6.25 MG CR tablet, Take 6.25 mg by mouth at bedtime as needed., Disp: , Rfl:    zolpidem (AMBIEN) 10 MG tablet, Take 10 mg by mouth at bedtime as needed for sleep. , Disp: , Rfl: 0  Past Medical History: Past Medical History:  Diagnosis Date   Hyperlipidemia    Hypertension    Sleep difficulties     Tobacco Use: Social History   Tobacco Use  Smoking Status Never  Smokeless Tobacco Never    Labs: Review Flowsheet        No data to display           Exercise Target Goals: Exercise Program Goal: Individual exercise prescription set using results from initial 6 min walk test and THRR while considering  patient's activity barriers and safety.   Exercise Prescription Goal: Initial exercise prescription builds to 30-45 minutes a day of aerobic activity, 2-3 days per week.  Home exercise guidelines will be given to patient during program as part of exercise prescription that the participant will acknowledge.   Education: Aerobic Exercise: - Group verbal and visual presentation on the components of exercise prescription. Introduces F.I.T.T principle from ACSM for exercise prescriptions.  Reviews F.I.T.T. principles of aerobic exercise including progression. Written material given at graduation.   Education: Resistance Exercise: - Group verbal and visual presentation on the components of exercise prescription. Introduces F.I.T.T principle from ACSM for exercise prescriptions  Reviews F.I.T.T. principles of resistance exercise including progression. Written material given at graduation.    Education: Exercise & Equipment Safety: - Individual verbal instruction and demonstration of equipment use and safety with use of the equipment. Flowsheet Row Cardiac Rehab from 07/08/2023 in [facility] Cardiac and Pulmonary Rehab  Date 06/22/23   Educator NT  Instruction Review Code 1- Verbalizes Understanding       Education: Exercise Physiology & General Exercise Guidelines: - Group verbal and written instruction with models to review the exercise physiology of the cardiovascular system and associated critical values. Provides general exercise guidelines with specific guidelines to those with heart or lung disease.    Education: Flexibility, Balance, Mind/Body Relaxation: - Group verbal and visual presentation with interactive activity on the components of exercise prescription. Introduces F.I.T.T principle from ACSM for exercise prescriptions. Reviews F.I.T.T. principles of flexibility and balance exercise training including progression. Also discusses the mind body connection.  Reviews various relaxation techniques to help reduce and manage stress (i.e. Deep breathing, progressive muscle relaxation, and visualization). Balance handout provided to take home. Written material given at graduation.   Activity Barriers & Risk Stratification:  Activity Barriers & Cardiac Risk Stratification - 06/22/23 1026       Activity Barriers & Cardiac Risk Stratification   Activity Barriers Shortness of Breath;Other (comment)    Comments Hx of rotator cuff surgery, degenerative joint disease    Cardiac Risk Stratification Moderate             6 Minute Walk:  6 Minute Walk     Row Name 06/22/23 1024         6 Minute Walk   Phase Initial     Distance 1410 feet     Walk Time 6 minutes     # of Rest Breaks 0     MPH 2.67     METS 3.16     RPE 9     Perceived Dyspnea  0     VO2 Peak 11.05     Symptoms No     Resting HR 65 bpm     Resting BP 106/64     Resting Oxygen Saturation  97 %     Exercise Oxygen Saturation  during 6 min walk 96 %     Max Ex. HR 91 bpm     Max Ex. BP 122/68     2 Minute Post BP 108/66              Oxygen Initial Assessment:   Oxygen Re-Evaluation:   Oxygen Discharge (Final Oxygen  Re-Evaluation):   Initial Exercise Prescription:  Initial Exercise Prescription - 06/22/23 1000       Date of Initial Exercise RX and Referring Provider   Date 06/22/23    Referring Provider Dr. Dorothyann Peng, MD      Oxygen   Maintain Oxygen Saturation 88% or higher      Treadmill   MPH 2.6    Grade 1    Minutes 15    METs 3.3      NuStep   Level 3    SPM 80    Minutes 15    METs 3.16      REL-XR   Level 3    Speed 50  Minutes 15    METs 3.16      Prescription Details   Frequency (times per week) 3    Duration Progress to 30 minutes of continuous aerobic without signs/symptoms of physical distress      Intensity   THRR 40-80% of Max Heartrate 103-141    Ratings of Perceived Exertion 11-13    Perceived Dyspnea 0-4      Progression   Progression Continue to progress workloads to maintain intensity without signs/symptoms of physical distress.      Resistance Training   Training Prescription Yes    Weight 7 lb    Reps 10-15             Perform Capillary Blood Glucose checks as needed.  Exercise Prescription Changes:   Exercise Prescription Changes     Row Name 06/22/23 1000             Response to Exercise   Blood Pressure (Admit) 106/64       Blood Pressure (Exercise) 122/68       Blood Pressure (Exit) 108/66       Heart Rate (Admit) 65 bpm       Heart Rate (Exercise) 91 bpm       Heart Rate (Exit) 64 bpm       Oxygen Saturation (Admit) 97 %       Oxygen Saturation (Exercise) 96 %       Rating of Perceived Exertion (Exercise) 9       Perceived Dyspnea (Exercise) 0       Symptoms none       Comments Results                Exercise Comments:   Exercise Comments     Row Name 06/24/23 0830           Exercise Comments First full day of exercise!  Patient was oriented to gym and equipment including functions, settings, policies, and procedures.  Patient's individual exercise prescription and treatment plan were reviewed.   All starting workloads were established based on the results of the 6 minute walk test done at initial orientation visit.  The plan for exercise progression was also introduced and progression will be customized based on patient's performance and goals.                Exercise Goals and Review:   Exercise Goals     Row Name 06/22/23 1026             Exercise Goals   Increase Physical Activity Yes       Intervention Provide advice, education, support and counseling about physical activity/exercise needs.;Develop an individualized exercise prescription for aerobic and resistive training based on initial evaluation findings, risk stratification, comorbidities and participant's personal goals.       Expected Outcomes Short Term: Attend rehab on a regular basis to increase amount of physical activity.;Long Term: Add in home exercise to make exercise part of routine and to increase amount of physical activity.;Long Term: Exercising regularly at least 3-5 days a week.       Increase Strength and Stamina Yes       Intervention Provide advice, education, support and counseling about physical activity/exercise needs.;Develop an individualized exercise prescription for aerobic and resistive training based on initial evaluation findings, risk stratification, comorbidities and participant's personal goals.       Expected Outcomes Short Term: Increase workloads from initial exercise prescription for resistance, speed, and METs.;Long  Term: Improve cardiorespiratory fitness, muscular endurance and strength as measured by increased METs and functional capacity ( );Short Term: Perform resistance training exercises routinely during rehab and add in resistance training at home       Able to understand and use rate of perceived exertion (RPE) scale Yes       Intervention Provide education and explanation on how to use RPE scale       Expected Outcomes Short Term: Able to use RPE daily in rehab to express  subjective intensity level;Long Term:  Able to use RPE to guide intensity level when exercising independently       Able to understand and use Dyspnea scale Yes       Intervention Provide education and explanation on how to use Dyspnea scale       Expected Outcomes Long Term: Able to use Dyspnea scale to guide intensity level when exercising independently;Short Term: Able to use Dyspnea scale daily in rehab to express subjective sense of shortness of breath during exertion       Knowledge and understanding of Target Heart Rate Range (THRR) Yes       Intervention Provide education and explanation of THRR including how the numbers were predicted and where they are located for reference       Expected Outcomes Short Term: Able to state/look up THRR;Long Term: Able to use THRR to govern intensity when exercising independently;Short Term: Able to use daily as guideline for intensity in rehab       Able to check pulse independently Yes       Intervention Provide education and demonstration on how to check pulse in carotid and radial arteries.;Review the importance of being able to check your own pulse for safety during independent exercise       Expected Outcomes Short Term: Able to explain why pulse checking is important during independent exercise;Long Term: Able to check pulse independently and accurately       Understanding of Exercise Prescription Yes       Intervention Provide education, explanation, and written materials on patient's individual exercise prescription       Expected Outcomes Short Term: Able to explain program exercise prescription;Long Term: Able to explain home exercise prescription to exercise independently                Exercise Goals Re-Evaluation :  Exercise Goals Re-Evaluation     Row Name 06/24/23 0830             Exercise Goal Re-Evaluation   Exercise Goals Review Able to understand and use rate of perceived exertion (RPE) scale;Able to understand and use  Dyspnea scale;Knowledge and understanding of Target Heart Rate Range (THRR);Understanding of Exercise Prescription       Comments Reviewed RPE and dyspnea scale, THR and program prescription with pt today.  Pt voiced understanding and was given a copy of goals to take home.       Expected Outcomes Short: Use RPE daily to regulate intensity. Long: Follow program prescription in THR.                Discharge Exercise Prescription (Final Exercise Prescription Changes):  Exercise Prescription Changes - 06/22/23 1000       Response to Exercise   Blood Pressure (Admit) 106/64    Blood Pressure (Exercise) 122/68    Blood Pressure (Exit) 108/66    Heart Rate (Admit) 65 bpm    Heart Rate (Exercise) 91 bpm    Heart Rate (Exit)  64 bpm    Oxygen Saturation (Admit) 97 %    Oxygen Saturation (Exercise) 96 %    Rating of Perceived Exertion (Exercise) 9    Perceived Dyspnea (Exercise) 0    Symptoms none    Comments Results             Nutrition:  Target Goals: Understanding of nutrition guidelines, daily intake of sodium 1500mg , cholesterol 200mg , calories 30% from fat and 7% or less from saturated fats, daily to have 5 or more servings of fruits and vegetables.  Education: All About Nutrition: -Group instruction provided by verbal, written material, interactive activities, discussions, models, and posters to present general guidelines for heart healthy nutrition including fat, fiber, MyPlate, the role of sodium in heart healthy nutrition, utilization of the nutrition label, and utilization of this knowledge for meal planning. Follow up email sent as well. Written material given at graduation. Flowsheet Row Cardiac Rehab from 07/08/2023 in [facility] Cardiac and Pulmonary Rehab  Education need identified 06/22/23       Biometrics:  Pre Biometrics - 06/22/23 1030       Pre Biometrics   Height 5' 10.25" (1.784 m)    Weight 235 lb 1.6 oz (106.6 kg)    Waist Circumference 42 inches     Hip Circumference 43 inches    Waist to Hip Ratio 0.98 %    BMI (Calculated) 33.51    Single Leg Stand 30 seconds              Nutrition Therapy Plan and Nutrition Goals:  Nutrition Therapy & Goals - 06/22/23 1532       Nutrition Therapy   Diet Cardiac, Low Na    Protein (specify units) 90    Fiber 30 grams    Whole Grain Foods 3 servings    Saturated Fats 15 max. grams    Fruits and Vegetables 5 servings/day    Sodium 2 grams      Personal Nutrition Goals   Nutrition Goal Read labels and reduce sodium intake to below 2300mg . Ideally 1500mg  per day.    Personal Goal #2 Reduce saturated fat, less than 12g per day. Replace bad fats for more heart healthy fats.    Personal Goal #3 Include more colorful produce, aim for 5-8 servings of fruits and veggies per day    Comments Patient drinking mostly water, around 64oz daily. Drinks 2 cups of coffee most mornings. He reports his Dr recommended he keep caffeine intake low. RD suggested as low as possible, but should be below 400mg  daily. Reviewed mediterranean diet handout. Educated on types of fats, sources, how to read labels. Set goal to read labels and monitor sodium intake to less than 1500mg  daily. Encouraged him to reduce his saturated fat intake and provided target of less than 15g per day. Build out a few meals and snacks with food he likes and will eat, focusing on low calorie nutrient rich foods low in sodium and saturated fat.      Intervention Plan   Intervention Prescribe, educate and counsel regarding individualized specific dietary modifications aiming towards targeted core components such as weight, hypertension, lipid management, diabetes, heart failure and other comorbidities.;Nutrition handout(s) given to patient.    Expected Outcomes Short Term Goal: Understand basic principles of dietary content, such as calories, fat, sodium, cholesterol and nutrients.;Short Term Goal: A plan has been developed with personal nutrition  goals set during dietitian appointment.;Long Term Goal: Adherence to prescribed nutrition plan.  Nutrition Assessments:  MEDIFICTS Score Key: >=70 Need to make dietary changes  40-70 Heart Healthy Diet <= 40 Therapeutic Level Cholesterol Diet  Flowsheet Row Cardiac Rehab from 06/18/2023 in [facility] Cardiac and Pulmonary Rehab  Picture Your Plate Total Score on Admission 55      Picture Your Plate Scores: <56 Unhealthy dietary pattern with much room for improvement. 41-50 Dietary pattern unlikely to meet recommendations for good health and room for improvement. 51-60 More healthful dietary pattern, with some room for improvement.  >60 Healthy dietary pattern, although there may be some specific behaviors that could be improved.    Nutrition Goals Re-Evaluation:   Nutrition Goals Discharge (Final Nutrition Goals Re-Evaluation):   Psychosocial: Target Goals: Acknowledge presence or absence of significant depression and/or stress, maximize coping skills, provide positive support system. Participant is able to verbalize types and ability to use techniques and skills needed for reducing stress and depression.   Education: Stress, Anxiety, and Depression - Group verbal and visual presentation to define topics covered.  Reviews how body is impacted by stress, anxiety, and depression.  Also discusses healthy ways to reduce stress and to treat/manage anxiety and depression.  Written material given at graduation.   Education: Sleep Hygiene -Provides group verbal and written instruction about how sleep can affect your health.  Define sleep hygiene, discuss sleep cycles and impact of sleep habits. Review good sleep hygiene tips.    Initial Review & Psychosocial Screening:  Initial Psych Review & Screening - 06/18/23 1356       Initial Review   Current issues with None Identified      Family Dynamics   Good Support System? Yes   wife, Selena Batten.     Screening Interventions    Interventions Encouraged to exercise;To provide support and resources with identified psychosocial needs;Provide feedback about the scores to participant    Expected Outcomes Short Term goal: Utilizing psychosocial counselor, staff and physician to assist with identification of specific Stressors or current issues interfering with healing process. Setting desired goal for each stressor or current issue identified.;Long Term Goal: Stressors or current issues are controlled or eliminated.;Short Term goal: Identification and review with participant of any Quality of Life or Depression concerns found by scoring the questionnaire.;Long Term goal: The participant improves quality of Life and PHQ9 Scores as seen by post scores and/or verbalization of changes             Quality of Life Scores:   Quality of Life - 06/18/23 1412       Quality of Life   Select Quality of Life      Quality of Life Scores   Health/Function Pre 23.33 %    Socioeconomic Pre 22.06 %    Psych/Spiritual Pre 24.43 %    Family Pre 30 %    GLOBAL Pre 24.21 %            Scores of 19 and below usually indicate a poorer quality of life in these areas.  A difference of  2-3 points is a clinically meaningful difference.  A difference of 2-3 points in the total score of the Quality of Life Index has been associated with significant improvement in overall quality of life, self-image, physical symptoms, and general health in studies assessing change in quality of life.  PHQ-9: Review Flowsheet       06/22/2023  Depression screen PHQ 2/9  Decreased Interest 0  Down, Depressed, Hopeless 0  PHQ - 2 Score 0  Altered sleeping  2  Tired, decreased energy 0  Change in appetite 0  Feeling bad or failure about yourself  0  Trouble concentrating 0  Moving slowly or fidgety/restless 0  Suicidal thoughts 0  PHQ-9 Score 2  Difficult doing work/chores Not difficult at all   Interpretation of Total Score  Total Score Depression  Severity:  1-4 = Minimal depression, 5-9 = Mild depression, 10-14 = Moderate depression, 15-19 = Moderately severe depression, 20-27 = Severe depression   Psychosocial Evaluation and Intervention:  Psychosocial Evaluation - 06/18/23 1403       Psychosocial Evaluation & Interventions   Interventions Encouraged to exercise with the program and follow exercise prescription    Comments No barriers to starting the program. Wants to lose weight, improve shortness of breath and learn heart healthy lifestyle steps.  He lives with his wife and she is his support.  He does have some sleep concerns that have been present for year or more. His pulmonary doctor is referring him to a Sleep doctor in [place].   He is waiting for that office to call him.   He should do well with the program    Expected Outcomes STG attends all scheduled sessions, meets with RD and follows exercise progression while in the program. Has appointment with a Sleep doctor.  LTG Continues with exercise progression, working on his weight loss and getting help with his sleep concerns after discharge    Continue Psychosocial Services  Follow up required by staff             Psychosocial Re-Evaluation:   Psychosocial Discharge (Final Psychosocial Re-Evaluation):   Vocational Rehabilitation: Provide vocational rehab assistance to qualifying candidates.   Vocational Rehab Evaluation & Intervention:  Vocational Rehab - 06/18/23 1359       Initial Vocational Rehab Evaluation & Intervention   Assessment shows need for Vocational Rehabilitation No      Vocational Rehab Re-Evaulation   Comments back at his job             Education: Education Goals: Education classes will be provided on a variety of topics geared toward better understanding of heart health and risk factor modification. Participant will state understanding/return demonstration of topics presented as noted by education test scores.  Learning  Barriers/Preferences:   General Cardiac Education Topics:  AED/CPR: - Group verbal and written instruction with the use of models to demonstrate the basic use of the AED with the basic ABC's of resuscitation.   Anatomy and Cardiac Procedures: - Group verbal and visual presentation and models provide information about basic cardiac anatomy and function. Reviews the testing methods done to diagnose heart disease and the outcomes of the test results. Describes the treatment choices: Medical Management, Angioplasty, or Coronary Bypass Surgery for treating various heart conditions including Myocardial Infarction, Angina, Valve Disease, and Cardiac Arrhythmias.  Written material given at graduation. Flowsheet Row Cardiac Rehab from 07/08/2023 in [facility] Cardiac and Pulmonary Rehab  Education need identified 06/22/23  Date 07/08/23  Educator sb  Instruction Review Code 1- Verbalizes Understanding       Medication Safety: - Group verbal and visual instruction to review commonly prescribed medications for heart and lung disease. Reviews the medication, class of the drug, and side effects. Includes the steps to properly store meds and maintain the prescription regimen.  Written material given at graduation.   Intimacy: - Group verbal instruction through game format to discuss how heart and lung disease can affect sexual intimacy. Written material given at  graduation..   Know Your Numbers and Heart Failure: - Group verbal and visual instruction to discuss disease risk factors for cardiac and pulmonary disease and treatment options.  Reviews associated critical values for Overweight/Obesity, Hypertension, Cholesterol, and Diabetes.  Discusses basics of heart failure: signs/symptoms and treatments.  Introduces Heart Failure Zone chart for action plan for heart failure.  Written material given at graduation.   Infection Prevention: - Provides verbal and written material to individual with discussion of  infection control including proper hand washing and proper equipment cleaning during exercise session. Flowsheet Row Cardiac Rehab from 07/08/2023 in [facility] Cardiac and Pulmonary Rehab  Date 06/22/23  Educator NT  Instruction Review Code 1- Verbalizes Understanding       Falls Prevention: - Provides verbal and written material to individual with discussion of falls prevention and safety. Flowsheet Row Cardiac Rehab from 07/08/2023 in [facility] Cardiac and Pulmonary Rehab  Date 06/18/23  Educator SB  Instruction Review Code 1- Verbalizes Understanding       Other: -Provides group and verbal instruction on various topics (see comments)   Knowledge Questionnaire Score:  Knowledge Questionnaire Score - 06/18/23 1412       Knowledge Questionnaire Score   Pre Score 23/26             Core Components/Risk Factors/Patient Goals at Admission:  Personal Goals and Risk Factors at Admission - 06/18/23 1359       Core Components/Risk Factors/Patient Goals on Admission    Weight Management Yes    Intervention Weight Management: Develop a combined nutrition and exercise program designed to reach desired caloric intake, while maintaining appropriate intake of nutrient and fiber, sodium and fats, and appropriate energy expenditure required for the weight goal.;Weight Management/Obesity: Establish reasonable short term and long term weight goals.    Admit Weight 230 lb (104.3 kg)    Goal Weight: Short Term 228 lb (103.4 kg)    Goal Weight: Long Term 210 lb (95.3 kg)    Expected Outcomes Long Term: Adherence to nutrition and physical activity/exercise program aimed toward attainment of established weight goal;Short Term: Continue to assess and modify interventions until short term weight is achieved;Weight Loss: Understanding of general recommendations for a balanced deficit meal plan, which promotes 1-2 lb weight loss per week and includes a negative energy balance of [phone] kcal/d    Lipids Yes     Intervention Provide education and support for participant on nutrition & aerobic/resistive exercise along with prescribed medications to achieve LDL 70mg , HDL >40mg .    Expected Outcomes Short Term: Participant states understanding of desired cholesterol values and is compliant with medications prescribed. Participant is following exercise prescription and nutrition guidelines.;Long Term: Cholesterol controlled with medications as prescribed, with individualized exercise RX and with personalized nutrition plan. Value goals: LDL < 70mg , HDL > 40 mg.             Education:Diabetes - Individual verbal and written instruction to review signs/symptoms of diabetes, desired ranges of glucose level fasting, after meals and with exercise. Acknowledge that pre and post exercise glucose checks will be done for 3 sessions at entry of program.   Core Components/Risk Factors/Patient Goals Review:    Core Components/Risk Factors/Patient Goals at Discharge (Final Review):    ITP Comments:  ITP Comments     Row Name 06/18/23 1410 06/22/23 1023 06/24/23 0830 07/08/23 1159 07/08/23 1200   ITP Comments Virtual orientation call completed today. he has an appointment on Date: 06/22/2023  for EP eval and  gym Orientation.  Documentation of diagnosis can be found in [facility] 06/09/2023 . Completed and gym orientation. Initial ITP created and sent for review to Dr. Bethann Punches, Medical Director. First full day of exercise!  Patient was oriented to gym and equipment including functions, settings, policies, and procedures.  Patient's individual exercise prescription and treatment plan were reviewed.  All starting workloads were established based on the results of the 6 minute walk test done at initial orientation visit.  The plan for exercise progression was also introduced and progression will be customized based on patient's performance and goals. 30 Day review completed. Medical Director ITP review done, changes  made as directed, and signed approval by Medical Director.    new to program 30 Day review completed. Medical Director ITP review done, changes made as directed, and signed approval by Medical Director.            Comments:

## 2023-07-10 ENCOUNTER — Encounter: Admitting: *Deleted

## 2023-07-10 DIAGNOSIS — Z955 Presence of coronary angioplasty implant and graft: Secondary | ICD-10-CM

## 2023-07-10 NOTE — Progress Notes (Signed)
 Daily Session Note  Patient Details  Name: Kurt Hammond MRN: [id number] Date of Birth: 22-Jan-1961 Referring Provider:   Flowsheet Row Cardiac Rehab from 06/22/2023 in [facility] Cardiac and Pulmonary Rehab  Referring Provider Dr. Dorothyann Peng, MD       Encounter Date: 07/10/2023  Check In:  Session Check In - 07/10/23 0928       Check-In   Supervising physician immediately available to respond to emergencies See telemetry face sheet for immediately available ER MD    Location [facility]-Cardiac & Pulmonary Rehab    Staff Present Cora Collum, RN, BSN, CCRP;Joseph Hood RCP,RRT,BSRT;Noah Tickle, BS, Exercise Physiologist    Virtual Visit No    Medication changes reported     No    Warm-up and Cool-down Performed on first and last piece of equipment    Resistance Training Performed Yes    VAD Patient? No    PAD/SET Patient? No      Pain Assessment   Currently in Pain? No/denies                Social History   Tobacco Use  Smoking Status Never  Smokeless Tobacco Never    Goals Met:  Independence with exercise equipment Exercise tolerated well No report of concerns or symptoms today  Goals Unmet:  Not Applicable  Comments: Pt able to follow exercise prescription today without complaint.  Will continue to monitor for progression.    Dr. Bethann Punches is Medical Director for [facility] Cardiac Rehabilitation.  Dr. Vida Rigger is Medical Director for [facility] Pulmonary Rehabilitation.

## 2023-07-13 ENCOUNTER — Encounter (INDEPENDENT_AMBULATORY_CARE_PROVIDER_SITE_OTHER): Payer: Self-pay | Admitting: Vascular Surgery

## 2023-07-13 ENCOUNTER — Encounter: Admitting: *Deleted

## 2023-07-13 DIAGNOSIS — Z955 Presence of coronary angioplasty implant and graft: Secondary | ICD-10-CM

## 2023-07-13 NOTE — Progress Notes (Signed)
 Daily Session Note  Patient Details  Name: Kurt Hammond MRN: [id number] Date of Birth: 1960-08-16 Referring Provider:   Flowsheet Row Cardiac Rehab from 06/22/2023 in [facility] Cardiac and Pulmonary Rehab  Referring Provider Dr. Burney Carter, MD       Encounter Date: 07/13/2023  Check In:  Session Check In - 07/13/23 0752       Check-In   Supervising physician immediately available to respond to emergencies See telemetry face sheet for immediately available ER MD    Location [facility]-Cardiac & Pulmonary Rehab    Staff Present Maud Sorenson, RN, BSN, CCRP;Maxon Conetta BS, Exercise Physiologist;Kelly [employer], ACSM CEP, Exercise Physiologist;Jason Martina Sledge RDN,LDN    Virtual Visit No    Medication changes reported     No    Fall or balance concerns reported    No    Warm-up and Cool-down Performed on first and last piece of equipment    Resistance Training Performed Yes    VAD Patient? No    PAD/SET Patient? No      Pain Assessment   Currently in Pain? No/denies                Social History   Tobacco Use  Smoking Status Never  Smokeless Tobacco Never    Goals Met:  Independence with exercise equipment Exercise tolerated well No report of concerns or symptoms today  Goals Unmet:  Not Applicable  Comments: Pt able to follow exercise prescription today without complaint.  Will continue to monitor for progression.    Dr. Firman Hughes is Medical Director for [facility] Cardiac Rehabilitation.  Dr. Fuad Aleskerov is Medical Director for [facility] Pulmonary Rehabilitation.

## 2023-07-15 ENCOUNTER — Other Ambulatory Visit (INDEPENDENT_AMBULATORY_CARE_PROVIDER_SITE_OTHER): Payer: Self-pay | Admitting: Vascular Surgery

## 2023-07-15 ENCOUNTER — Encounter: Admitting: *Deleted

## 2023-07-15 DIAGNOSIS — Z955 Presence of coronary angioplasty implant and graft: Secondary | ICD-10-CM

## 2023-07-15 DIAGNOSIS — I8002 Phlebitis and thrombophlebitis of superficial vessels of left lower extremity: Secondary | ICD-10-CM

## 2023-07-15 NOTE — Progress Notes (Signed)
 Daily Session Note  Patient Details  Name: Kurt Hammond MRN: [id number] Date of Birth: [birth year] Referring Provider:   Flowsheet Row Cardiac Rehab from 06/22/2023 in [facility] Cardiac and Pulmonary Rehab  Referring Provider Dr. Burney Carter, MD       Encounter Date: 07/15/2023  Check In:  Session Check In - 07/15/23 0747       Check-In   Supervising physician immediately available to respond to emergencies See telemetry face sheet for immediately available ER MD    Location [facility]-Cardiac & Pulmonary Rehab    Staff Present Lyell Samuel, MS, Exercise Physiologist;Maxon Conetta BS, Exercise Physiologist;Jason Martina Sledge RDN,LDN;Shandon Burlingame, RN, BSN, CCRP    Virtual Visit No    Medication changes reported     No    Fall or balance concerns reported    No    Warm-up and Cool-down Performed on first and last piece of equipment    Resistance Training Performed Yes    VAD Patient? No    PAD/SET Patient? No      Pain Assessment   Currently in Pain? No/denies                Social History   Tobacco Use  Smoking Status Never  Smokeless Tobacco Never    Goals Met:  Independence with exercise equipment Exercise tolerated well No report of concerns or symptoms today  Goals Unmet:  Not Applicable  Comments: Pt able to follow exercise prescription today without complaint.  Will continue to monitor for progression.    Dr. Firman Hughes is Medical Director for [facility] Cardiac Rehabilitation.  Dr. Fuad Aleskerov is Medical Director for [facility] Pulmonary Rehabilitation.

## 2023-07-16 ENCOUNTER — Ambulatory Visit (INDEPENDENT_AMBULATORY_CARE_PROVIDER_SITE_OTHER): Payer: 59 | Admitting: Nurse Practitioner

## 2023-07-16 ENCOUNTER — Encounter (INDEPENDENT_AMBULATORY_CARE_PROVIDER_SITE_OTHER): Payer: 59

## 2023-07-17 ENCOUNTER — Encounter: Admitting: *Deleted

## 2023-07-17 DIAGNOSIS — Z955 Presence of coronary angioplasty implant and graft: Secondary | ICD-10-CM | POA: Diagnosis not present

## 2023-07-17 NOTE — Progress Notes (Signed)
 Daily Session Note  Patient Details  Name: Kurt Hammond MRN: [id number] Date of Birth: 07/05/1960 Referring Provider:   Flowsheet Row Cardiac Rehab from 06/22/2023 in [facility] Cardiac and Pulmonary Rehab  Referring Provider Dr. Cara Lovelace, MD       Encounter Date: 07/17/2023  Check In:  Session Check In - 07/17/23 0742       Check-In   Supervising physician immediately available to respond to emergencies See telemetry face sheet for immediately available ER MD    Location [facility]-Cardiac & Pulmonary Rehab    Staff Present Bruno Mirza RN,BSN;Noah Tickle, BS, Exercise Physiologist;Kelly Dyane HECKLE, ACSM CEP, Exercise Physiologist    Virtual Visit No    Medication changes reported     No    Fall or balance concerns reported    No    Warm-up and Cool-down Performed on first and last piece of equipment    Resistance Training Performed Yes    VAD Patient? No    PAD/SET Patient? No      Pain Assessment   Currently in Pain? No/denies    Multiple Pain Sites No                Social History   Tobacco Use  Smoking Status Never  Smokeless Tobacco Never    Goals Met:  Independence with exercise equipment Exercise tolerated well No report of concerns or symptoms today Strength training completed today  Goals Unmet:  Not Applicable  Comments: Pt able to follow exercise prescription today without complaint.  Will continue to monitor for progression.    Dr. Oneil Pinal is Medical Director for [facility] Cardiac Rehabilitation.  Dr. Fuad Aleskerov is Medical Director for [facility] Pulmonary Rehabilitation.

## 2023-07-20 ENCOUNTER — Encounter: Admitting: *Deleted

## 2023-07-20 ENCOUNTER — Ambulatory Visit (INDEPENDENT_AMBULATORY_CARE_PROVIDER_SITE_OTHER)

## 2023-07-20 DIAGNOSIS — Z955 Presence of coronary angioplasty implant and graft: Secondary | ICD-10-CM | POA: Diagnosis not present

## 2023-07-20 DIAGNOSIS — I8002 Phlebitis and thrombophlebitis of superficial vessels of left lower extremity: Secondary | ICD-10-CM

## 2023-07-20 NOTE — Progress Notes (Signed)
 Daily Session Note  Patient Details  Name: Kurt Hammond MRN: [id number] Date of Birth: Jul 14, 1960 Referring Provider:   Flowsheet Row Cardiac Rehab from 06/22/2023 in [facility] Cardiac and Pulmonary Rehab  Referring Provider Dr. Burney Carter, MD       Encounter Date: 07/20/2023  Check In:  Session Check In - 07/20/23 0804       Check-In   Supervising physician immediately available to respond to emergencies See telemetry face sheet for immediately available ER MD    Location [facility]-Cardiac & Pulmonary Rehab    Staff Present Maud Sorenson, RN, BSN, CCRP;Joseph Hood RCP,RRT,BSRT;Kelly Rose Bud BS, ACSM CEP, Exercise Physiologist;Jason Martina Sledge RDN,LDN    Virtual Visit No    Medication changes reported     No    Fall or balance concerns reported    No    Warm-up and Cool-down Performed on first and last piece of equipment    Resistance Training Performed Yes    VAD Patient? No    PAD/SET Patient? No      Pain Assessment   Currently in Pain? No/denies                Social History   Tobacco Use  Smoking Status Never  Smokeless Tobacco Never    Goals Met:  Independence with exercise equipment Exercise tolerated well No report of concerns or symptoms today  Goals Unmet:  Not Applicable  Comments: Pt able to follow exercise prescription today without complaint.  Will continue to monitor for progression.    Dr. Firman Hughes is Medical Director for [facility] Cardiac Rehabilitation.  Dr. Fuad Aleskerov is Medical Director for [facility] Pulmonary Rehabilitation.

## 2023-07-22 ENCOUNTER — Encounter: Admitting: *Deleted

## 2023-07-22 DIAGNOSIS — Z955 Presence of coronary angioplasty implant and graft: Secondary | ICD-10-CM | POA: Diagnosis not present

## 2023-07-22 NOTE — Progress Notes (Signed)
 Daily Session Note  Patient Details  Name: Kurt Hammond MRN: [id number] Date of Birth: October 24, 1960 Referring Provider:   Flowsheet Row Cardiac Rehab from 06/22/2023 in [facility] Cardiac and Pulmonary Rehab  Referring Provider Dr. Burney Carter, MD       Encounter Date: 07/22/2023  Check In:  Session Check In - 07/22/23 0751       Check-In   Supervising physician immediately available to respond to emergencies See telemetry face sheet for immediately available ER MD    Location [facility]-Cardiac & Pulmonary Rehab    Staff Present Lyell Samuel, MS, Exercise Physiologist;Maxon Conetta BS, Exercise Physiologist;[employer];Maud Sorenson, RN, BSN, CCRP    Virtual Visit No    Medication changes reported     No    Fall or balance concerns reported    No    Warm-up and Cool-down Performed on first and last piece of equipment    Resistance Training Performed Yes    VAD Patient? No    PAD/SET Patient? No      Pain Assessment   Currently in Pain? No/denies                Social History   Tobacco Use  Smoking Status Never  Smokeless Tobacco Never    Goals Met:  Independence with exercise equipment Exercise tolerated well No report of concerns or symptoms today  Goals Unmet:  Not Applicable  Comments: Pt able to follow exercise prescription today without complaint.  Will continue to monitor for progression.    Dr. Firman Hughes is Medical Director for [facility] Cardiac Rehabilitation.  Dr. Fuad Aleskerov is Medical Director for [facility] Pulmonary Rehabilitation.

## 2023-07-24 ENCOUNTER — Encounter: Admitting: *Deleted

## 2023-07-24 DIAGNOSIS — Z955 Presence of coronary angioplasty implant and graft: Secondary | ICD-10-CM

## 2023-07-24 NOTE — Progress Notes (Signed)
 Daily Session Note  Patient Details  Name: Kurt Hammond MRN: [id number] Date of Birth: 1960-06-06 Referring Provider:   Flowsheet Row Cardiac Rehab from 06/22/2023 in [facility] Cardiac and Pulmonary Rehab  Referring Provider Dr. Burney Carter, MD       Encounter Date: 07/24/2023  Check In:  Session Check In - 07/24/23 0751       Check-In   Supervising physician immediately available to respond to emergencies See telemetry face sheet for immediately available ER MD    Location [facility]-Cardiac & Pulmonary Rehab    Staff Present Maud Sorenson, RN, BSN, CCRP;Joseph Hood RCP,RRT,BSRT;Noah Tickle, [place], Exercise Physiologist    Virtual Visit No    Medication changes reported     No    Fall or balance concerns reported    No    Warm-up and Cool-down Performed on first and last piece of equipment    Resistance Training Performed Yes    VAD Patient? No    PAD/SET Patient? No      Pain Assessment   Currently in Pain? No/denies                Social History   Tobacco Use  Smoking Status Never  Smokeless Tobacco Never    Goals Met:  Independence with exercise equipment Exercise tolerated well No report of concerns or symptoms today  Goals Unmet:  Not Applicable  Comments: Pt able to follow exercise prescription today without complaint.  Will continue to monitor for progression.    Dr. Firman Hughes is Medical Director for [facility] Cardiac Rehabilitation.  Dr. Fuad Aleskerov is Medical Director for [facility] Pulmonary Rehabilitation.

## 2023-07-27 ENCOUNTER — Encounter: Admitting: *Deleted

## 2023-07-27 DIAGNOSIS — Z955 Presence of coronary angioplasty implant and graft: Secondary | ICD-10-CM

## 2023-07-27 NOTE — Progress Notes (Signed)
 Daily Session Note  Patient Details  Name: STEELE ZIMMERLY MRN: [id number] Date of Birth: 1960-10-25 Referring Provider:   Flowsheet Row Cardiac Rehab from 06/22/2023 in [facility] Cardiac and Pulmonary Rehab  Referring Provider Dr. Burney Carter, MD       Encounter Date: 07/27/2023  Check In:  Session Check In - 07/27/23 0746       Check-In   Supervising physician immediately available to respond to emergencies See telemetry face sheet for immediately available ER MD    Location [facility]-Cardiac & Pulmonary Rehab    Staff Present Freddrick Jaffe BS, ACSM CEP, Exercise Physiologist;Susanne Bice, RN, BSN, CCRP;Jason Martina Sledge RDN,LDN;Joseph [employer]    Virtual Visit No    Medication changes reported     No    Fall or balance concerns reported    No    Warm-up and Cool-down Performed on first and last piece of equipment    Resistance Training Performed Yes    VAD Patient? No    PAD/SET Patient? No      Pain Assessment   Currently in Pain? No/denies    Multiple Pain Sites No                Social History   Tobacco Use  Smoking Status Never  Smokeless Tobacco Never    Goals Met:  Independence with exercise equipment Exercise tolerated well Personal goals reviewed No report of concerns or symptoms today  Goals Unmet:  Not Applicable  Comments: Pt able to follow exercise prescription today without complaint.  Will continue to monitor for progression.    Reviewed home exercise with pt today.  Pt plans to use a gym where his son works for exercise.  Reviewed THR, pulse, RPE, sign and symptoms, pulse oximetery and when to call 911 or MD.  Also discussed weather considerations and indoor options.  Pt voiced understanding.   Dr. Firman Hughes is Medical Director for [facility] Cardiac Rehabilitation.  Dr. Fuad Aleskerov is Medical Director for [facility] Pulmonary Rehabilitation.

## 2023-07-29 ENCOUNTER — Encounter: Admitting: *Deleted

## 2023-07-29 DIAGNOSIS — Z955 Presence of coronary angioplasty implant and graft: Secondary | ICD-10-CM | POA: Diagnosis not present

## 2023-07-29 NOTE — Progress Notes (Signed)
 Daily Session Note  Patient Details  Name: Kurt Hammond MRN: [id number] Date of Birth: 1961-03-14 Referring Provider:   Flowsheet Row Cardiac Rehab from 06/22/2023 in [facility] Cardiac and Pulmonary Rehab  Referring Provider Dr. Burney Carter, MD       Encounter Date: 07/29/2023  Check In:  Session Check In - 07/29/23 0746       Check-In   Supervising physician immediately available to respond to emergencies See telemetry face sheet for immediately available ER MD    Location [facility]-Cardiac & Pulmonary Rehab    Staff Present Maxon Conetta BS, Exercise Physiologist;Noah Tickle, BS, Exercise Physiologist;Joseph Hood RCP,RRT,BSRT;Maud Sorenson, RN, BSN, CCRP    Virtual Visit No    Medication changes reported     No    Fall or balance concerns reported    No    Warm-up and Cool-down Performed on first and last piece of equipment    Resistance Training Performed Yes    VAD Patient? No    PAD/SET Patient? No      Pain Assessment   Currently in Pain? No/denies                Social History   Tobacco Use  Smoking Status Never  Smokeless Tobacco Never    Goals Met:  Independence with exercise equipment Exercise tolerated well No report of concerns or symptoms today  Goals Unmet:  Not Applicable  Comments: Pt able to follow exercise prescription today without complaint.  Will continue to monitor for progression.    Dr. Firman Hughes is Medical Director for [facility] Cardiac Rehabilitation.  Dr. Fuad Aleskerov is Medical Director for [facility] Pulmonary Rehabilitation.

## 2023-07-31 ENCOUNTER — Encounter: Attending: Internal Medicine | Admitting: *Deleted

## 2023-07-31 DIAGNOSIS — Z955 Presence of coronary angioplasty implant and graft: Secondary | ICD-10-CM | POA: Insufficient documentation

## 2023-07-31 IMAGING — CT CT CARDIAC CORONARY ARTERY CALCIUM SCORE
1 of 2 series · 11 of 20 positions shown, 14 images · non-contrast
Comparison: Chest two views 02/24/2019; CTA chest 05/27/2013

Addendum:
CLINICAL DATA: Risk stratification

EXAM:
Coronary Calcium Score
TECHNIQUE: The patient was scanned on a Siemens Somatom go.Top Scanner. Axial
non-contrast 3 mm slices were carried out through the heart. The
data set was analyzed on a dedicated work station and scored using
the Agatson method.

[Series 2: casc 3.0 i36f bestdiast 65 % · axial · 0.39mm/px · z∈[+1194,+1304]mm · 11 of 89 slices shown, 14 images]
[im 8/89  vessel]
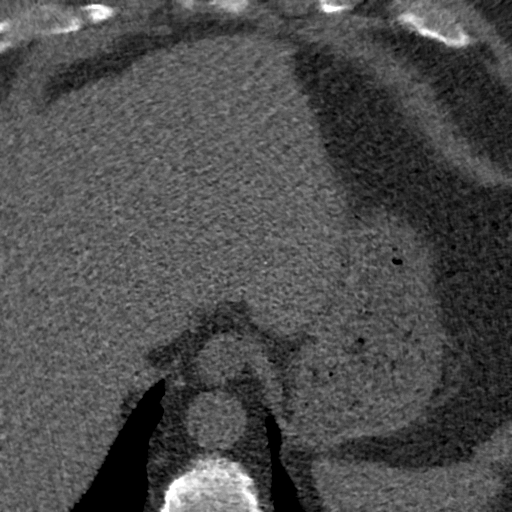
[im 8/89  lung]
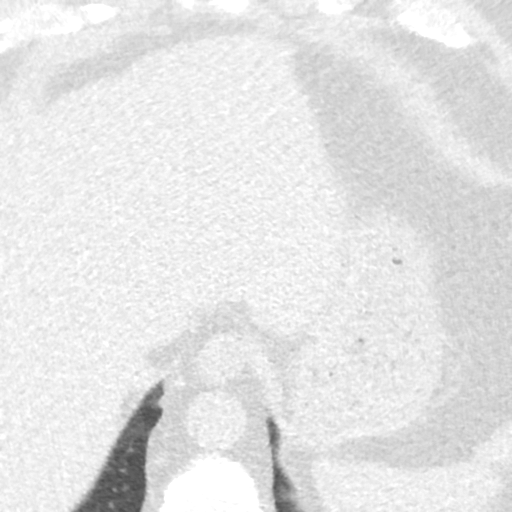
[im 15/89  vessel]
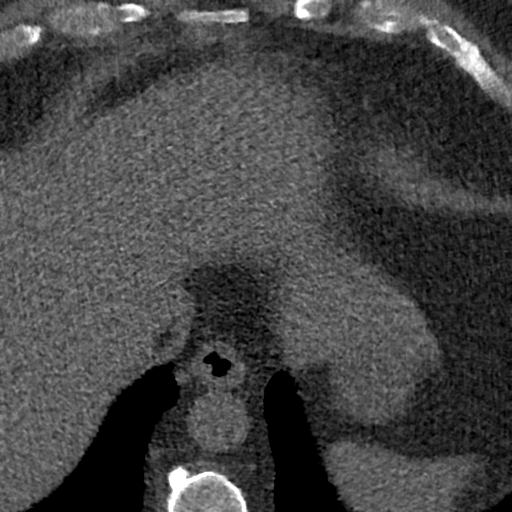
[im 23/89  vessel]
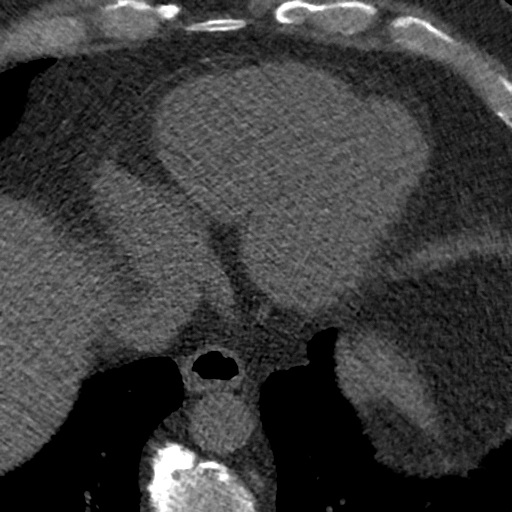
[im 30/89  vessel]
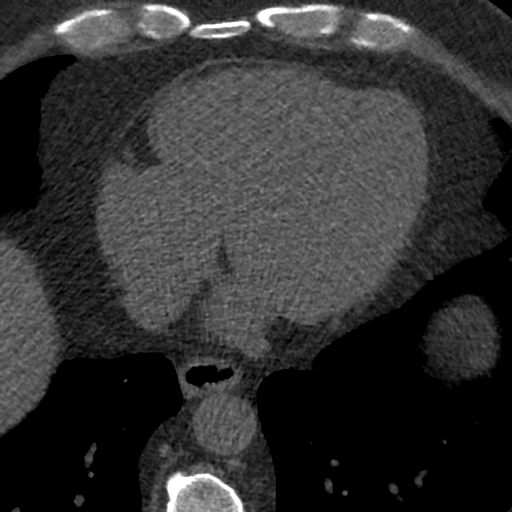
[im 37/89  vessel]
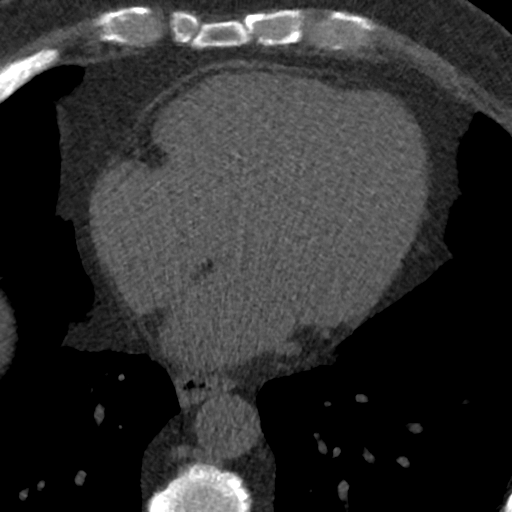
[im 37/89  lung]
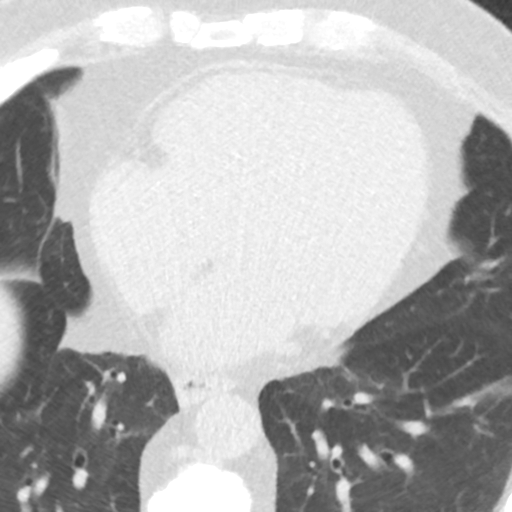
[im 45/89  vessel]
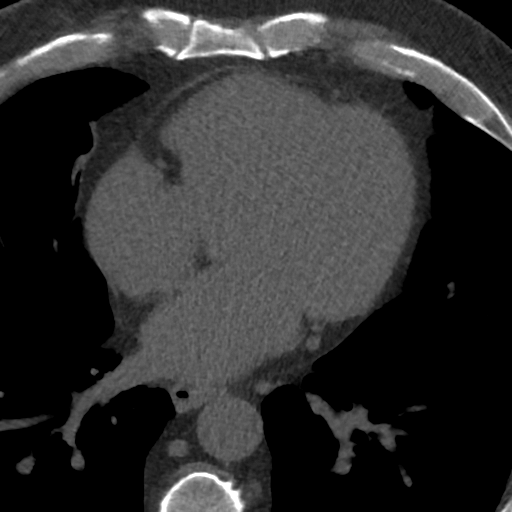
[im 52/89  vessel]
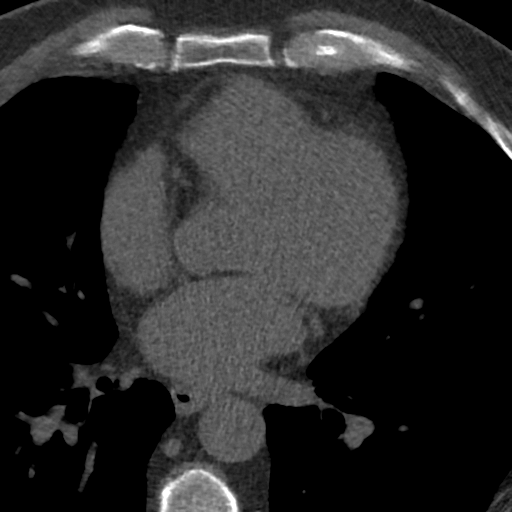
[im 59/89  vessel]
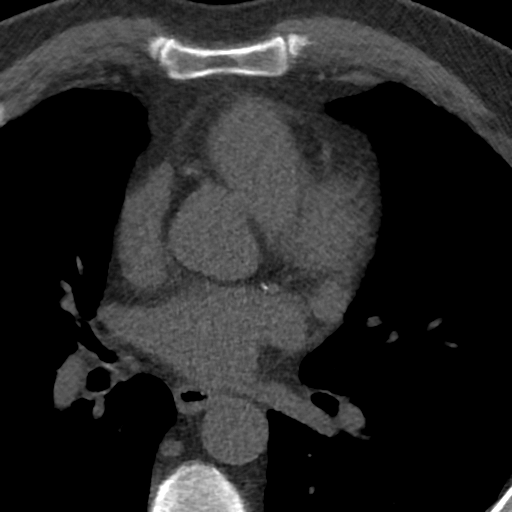
[im 67/89  vessel]
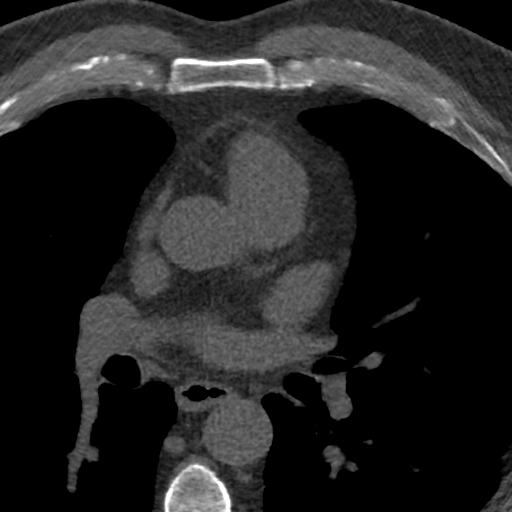
[im 67/89  lung]
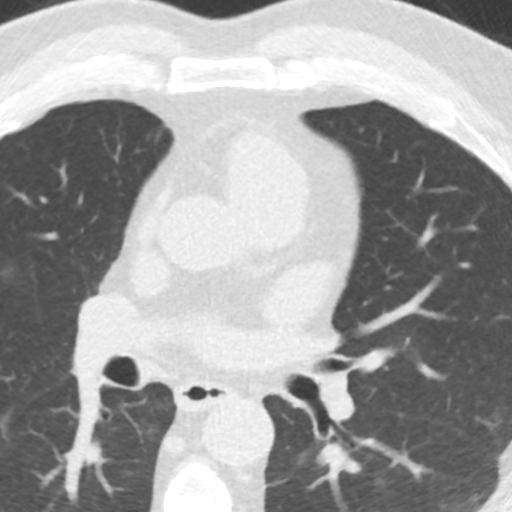
[im 74/89  vessel]
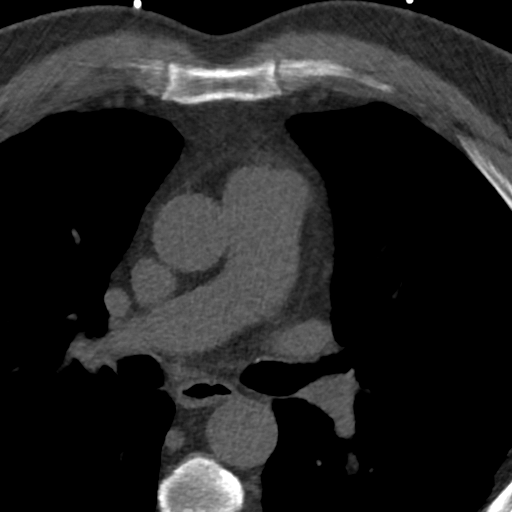
[im 81/89  vessel]
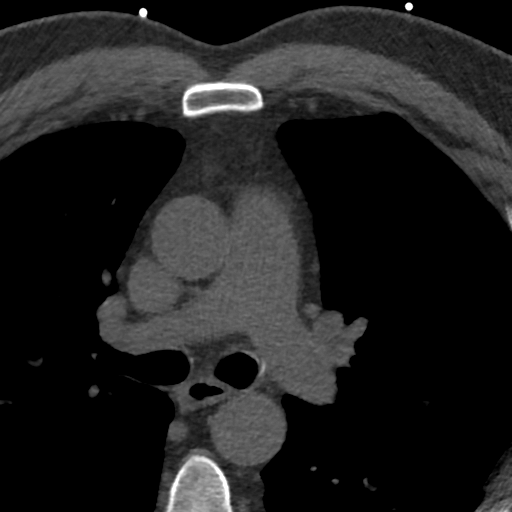

[11 of 20 positions shown; findings below may reference images not displayed]

FINDINGS: Non-cardiac: See separate report from [REDACTED].

Ascending Aorta: Normal size

Pericardium: Normal

Coronary arteries: Normal origin of left and right coronary
arteries. Distribution of arterial calcifications if present, as
noted below;

LM 0

LAD

LCx

RCA 0

Total

IMPRESSION AND RECOMMENDATION:
1. Coronary calcium score of 21.3. This was 52nd percentile for age
and sex matched control.

2. CAC 1-99 in LAD, LCx.  KHESARI SMITHA1/N2.

3. Continue heart healthy lifestyle and risk factor modification.

EXAM:
OVER-READ INTERPRETATION  CT CHEST

The following report is an over-read performed by radiologist Dr.
Maiski Farin [REDACTED] on 09/10/2021. This
over-read does not include interpretation of cardiac or coronary
anatomy or pathology. The coronary calcium score interpretation by
the cardiologist is attached.
FINDINGS: Cardiovascular: There are no significant extracardiac vascular
findings.

Mediastinum/Nodes: There are no enlarged lymph nodes within the
visualized mediastinum.

Lungs/Pleura: There is no pleural effusion. Mild curvilinear
subsegmental atelectasis versus more likely chronic scarring within
the medial right middle lobe, anterior and posterior aspects of the
right lower lobe, inferior lingula, and anterior and posterior
aspect of the left lower lobe, similar to prior 05/27/2013 CT.

Upper abdomen: No significant findings in the visualized upper
abdomen.

Musculoskeletal/Chest wall: Mild degenerative disc and endplate
changes of the visualized mid to lower thoracic spine.
IMPRESSION: No significant extracardiac findings within the visualized chest.

*** End of Addendum ***
FINDINGS: Non-cardiac: See separate report from [REDACTED].

Ascending Aorta: Normal size

Pericardium: Normal

Coronary arteries: Normal origin of left and right coronary
arteries. Distribution of arterial calcifications if present, as
noted below;

LM 0

LAD

LCx

RCA 0

Total

IMPRESSION AND RECOMMENDATION:
1. Coronary calcium score of 21.3. This was 52nd percentile for age
and sex matched control.

2. CAC 1-99 in LAD, LCx.  KHESARI SMITHA1/N2.

3. Continue heart healthy lifestyle and risk factor modification.

## 2023-07-31 NOTE — Progress Notes (Signed)
 Daily Session Note  Patient Details  Name: VAHID GRAESSLE MRN: [id number] Date of Birth: Feb 17, 1961 Referring Provider:   Flowsheet Row Cardiac Rehab from 06/22/2023 in [facility] Cardiac and Pulmonary Rehab  Referring Provider Dr. Burney Carter, MD       Encounter Date: 07/31/2023  Check In:  Session Check In - 07/31/23 0758       Check-In   Supervising physician immediately available to respond to emergencies See telemetry face sheet for immediately available ER MD    Location [facility]-Cardiac & Pulmonary Rehab    Staff Present Sherle Dire, BS, Exercise Physiologist;Joseph Nancey Awkward;Maud Sorenson, RN, BSN, CCRP    Virtual Visit No    Medication changes reported     No    Fall or balance concerns reported    No    Warm-up and Cool-down Performed on first and last piece of equipment    Resistance Training Performed Yes    VAD Patient? No    PAD/SET Patient? No      Pain Assessment   Currently in Pain? No/denies                Social History   Tobacco Use  Smoking Status Never  Smokeless Tobacco Never    Goals Met:  Independence with exercise equipment Exercise tolerated well No report of concerns or symptoms today  Goals Unmet:  Not Applicable  Comments: Pt able to follow exercise prescription today without complaint.  Will continue to monitor for progression.    Dr. Firman Hughes is Medical Director for [facility] Cardiac Rehabilitation.  Dr. Fuad Aleskerov is Medical Director for [facility] Pulmonary Rehabilitation.

## 2023-08-03 ENCOUNTER — Encounter: Admitting: *Deleted

## 2023-08-03 DIAGNOSIS — Z955 Presence of coronary angioplasty implant and graft: Secondary | ICD-10-CM

## 2023-08-03 NOTE — Progress Notes (Signed)
 Daily Session Note  Patient Details  Name: Kurt Hammond MRN: [id number] Date of Birth: July 16, 1960 Referring Provider:   Flowsheet Row Cardiac Rehab from 06/22/2023 in [facility] Cardiac [facility] Pulmonary Rehab  Referring Provider Dr. Burney Carter, MD       Encounter Date: 08/03/2023  Check In:  Session Check In - 08/03/23 0809       Check-In   Supervising physician immediately available to respond to emergencies See telemetry face sheet for immediately available ER MD    Location [facility]-Cardiac & Pulmonary Rehab    Staff Present Maud Sorenson, RN, BSN, CCRP;Joseph Hood RCP,RRT,BSRT;Kelly Culloden BS, ACSM CEP, Exercise Physiologist;Jason Martina Sledge RDN,LDN    Virtual Visit No    Medication changes reported     No    Fall or balance concerns reported    No    Warm-up and Cool-down Performed on first and last piece of equipment    Resistance Training Performed Yes    VAD Patient? No    PAD/SET Patient? No      Pain Assessment   Currently in Pain? No/denies                Social History   Tobacco Use  Smoking Status Never  Smokeless Tobacco Never    Goals Met:  Independence with exercise equipment Exercise tolerated well No report of concerns or symptoms today  Goals Unmet:  Not Applicable  Comments: Pt able to follow exercise prescription today without complaint.  Will continue to monitor for progression.    Dr. Firman Hughes is Medical Director for [facility] Cardiac Rehabilitation.  Dr. Fuad Aleskerov is Medical Director for [facility] Pulmonary Rehabilitation.

## 2023-08-05 ENCOUNTER — Encounter (INDEPENDENT_AMBULATORY_CARE_PROVIDER_SITE_OTHER): Payer: Self-pay | Admitting: Nurse Practitioner

## 2023-08-05 ENCOUNTER — Ambulatory Visit (INDEPENDENT_AMBULATORY_CARE_PROVIDER_SITE_OTHER): Admitting: Nurse Practitioner

## 2023-08-05 ENCOUNTER — Encounter: Admitting: *Deleted

## 2023-08-05 VITALS — BP 114/65 | HR 56 | Resp 16 | Wt 230.0 lb

## 2023-08-05 DIAGNOSIS — I8312 Varicose veins of left lower extremity with inflammation: Secondary | ICD-10-CM

## 2023-08-05 DIAGNOSIS — I8002 Phlebitis and thrombophlebitis of superficial vessels of left lower extremity: Secondary | ICD-10-CM | POA: Diagnosis not present

## 2023-08-05 DIAGNOSIS — E785 Hyperlipidemia, unspecified: Secondary | ICD-10-CM | POA: Diagnosis not present

## 2023-08-05 DIAGNOSIS — Z955 Presence of coronary angioplasty implant and graft: Secondary | ICD-10-CM

## 2023-08-05 NOTE — Progress Notes (Signed)
 Subjective:    Patient ID: Kurt Hammond, male    DOB: November 16, 1960, 63 y.o.   MRN: [id number] Chief Complaint  Patient presents with   Follow-up    3-4 month left le duplex    The patient is a 63 year old male who presents today for follow-up of a thrombophlebitis in his left lower extremity.  He notes that the pain and discomfort that he experienced have all improved.  However since this occurrence he has recently been diagnosed with atrial fibrillation as well as had NSTEMI with 2 stents placed in his heart.  He is currently recovering well from his heart catheterization.  Today he underwent noninvasive studies which showed no evidence of DVT in the left lower extremity.  The previous superficial phlebitis is chronic in nature at this time.  There is no evidence of deep venous insufficiency but significant superficial reflux throughout the left great saphenous vein.    Review of Systems  Cardiovascular:  Negative for leg swelling.  All other systems reviewed and are negative.      Objective:    Physical Exam Vitals reviewed.  HENT:     Head: Normocephalic.  Cardiovascular:     Rate and Rhythm: Normal rate. Rhythm irregular.     Pulses: Normal pulses.  Pulmonary:     Effort: Pulmonary effort is normal.  Skin:    General: Skin is warm and dry.  Neurological:     Mental Status: He is alert and oriented to person, place, and time.  Psychiatric:        Mood and Affect: Mood normal.        Behavior: Behavior normal.        Thought Content: Thought content normal.        Judgment: Judgment normal.     BP 114/65   Pulse (!) 56   Resp 16   Wt 230 lb (104.3 kg)   BMI 32.77 kg/m   Past Medical History:  Diagnosis Date   Hyperlipidemia    Hypertension    Sleep difficulties     Social History   Socioeconomic History   Marital status: Married    Spouse name: Not on file   Number of children: Not on file   Years of education: Not on file   Highest education  level: Not on file  Occupational History   Not on file  Tobacco Use   Smoking status: Never   Smokeless tobacco: Never  Vaping Use   Vaping status: Never Used  Substance and Sexual Activity   Alcohol use: Yes   Drug use: Never   Sexual activity: Not on file  Other Topics Concern   Not on file  Social History Narrative   Not on file   Social Drivers of Health   Financial Resource Strain: Patient Declined (06/11/2023)   Received from [facility]   Overall Financial Resource Strain (CARDIA)    Difficulty of Paying Living Expenses: Patient declined  Food Insecurity: Patient Declined (06/11/2023)   Received from [facility]   Hunger Vital Sign    Worried About Running Out of Food in the Last Year: Patient declined    Ran Out of Food in the Last Year: Patient declined  Transportation Needs: Patient Declined (06/11/2023)   Received from [facility] - Transportation    In the past 12 months, has lack of transportation kept you from medical appointments or from getting medications?: Patient declined  Lack of Transportation (Non-Medical): Patient declined  Physical Activity: Not on file  Stress: Not on file  Social Connections: Not on file  Intimate Partner Violence: Not At Risk (06/09/2023)   Humiliation, Afraid, Rape, and Kick questionnaire    Fear of Current or Ex-Partner: No    Emotionally Abused: No    Physically Abused: No    Sexually Abused: No    Past Surgical History:  Procedure Laterality Date   CORONARY STENT INTERVENTION N/A 06/09/2023   Procedure: CORONARY STENT INTERVENTION;  Surgeon: Antonette Batters, MD;  Location: [facility] INVASIVE CV LAB;  Service: Cardiovascular;  Laterality: N/A;   CORONARY STENT INTERVENTION N/A 06/10/2023   Procedure: CORONARY STENT INTERVENTION;  Surgeon: Antonette Batters, MD;  Location: [facility] INVASIVE CV LAB;  Service: Cardiovascular;  Laterality: N/A;   LEFT HEART CATH AND  CORONARY ANGIOGRAPHY N/A 06/09/2023   Procedure: LEFT HEART CATH AND CORONARY ANGIOGRAPHY;  Surgeon: Antonette Batters, MD;  Location: [facility] INVASIVE CV LAB;  Service: Cardiovascular;  Laterality: N/A;    Family History  Problem Relation Age of Onset   Cancer Mother    Heart disease Father    Heart disease Maternal Grandfather    Heart attack Maternal Grandfather    Heart disease Paternal Grandfather    Heart attack Paternal Grandfather     No Known Allergies     Latest Ref Rng & Units 06/10/2023    6:22 AM 06/09/2023    8:55 AM 02/24/2019   11:24 AM  CBC  WBC 4.0 - 10.5 K/uL 5.6  4.9  6.8   Hemoglobin 13.0 - 17.0 g/dL 29.5  62.1  30.8   Hematocrit 39.0 - 52.0 % 42.2  49.3  45.5   Platelets 150 - 400 K/uL 268  272  211        CMP     Component Value Date/Time   NA 139 06/10/2023 0622   K 4.0 06/10/2023 0622   CL 109 06/10/2023 0622   CO2 21 (L) 06/10/2023 0622   GLUCOSE 104 (H) 06/10/2023 0622   BUN 15 06/10/2023 0622   CREATININE 1.01 06/10/2023 0622   CALCIUM  9.0 06/10/2023 0622   GFRNONAA >60 06/10/2023 0622     No results found.     Assessment & Plan:   1. Varicose veins of left lower extremity with inflammation (Primary) Recommend  I have reviewed my previous  discussion with the patient regarding  varicose veins and why they cause symptoms. Patient will continue  wearing graduated compression stockings class 1 on a daily basis, beginning first thing in the morning and removing them in the evening.  The patient is CEAP [id number].  The patient has been wearing compression for more than 12 weeks with no or little benefit.  The patient has been exercising daily for more than 12 weeks. The patient has been elevating and taking OTC pain medications for more than 12 weeks.  None of these have have eliminated the pain related to the varicose veins and venous reflux or the discomfort regarding venous congestion.    In addition, behavioral modification including  elevation during the day was again discussed and this will continue.  The patient has utilized over the counter pain medications and has been exercising.  However, at this time conservative therapy has not alleviated the patient's symptoms of leg pain and swelling  Recommend: laser ablation of the left great saphenous veins to eliminate the symptoms of pain and swelling of the lower extremities caused by  the severe superficial venous reflux disease.   2. 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  Patient remains on Eliquis for his atrial fibrillation.  Will continue to follow guidance from cardiology regarding this.  Patient advised to continue with use of medical grade compression stockings as noted above.  3. Hyperlipidemia, unspecified hyperlipidemia type Continue statin as ordered and reviewed, no changes at this time   Current Outpatient Medications on File Prior to Visit  Medication Sig Dispense Refill   albuterol (VENTOLIN HFA) 108 (90 Base) MCG/ACT inhaler Inhale 2 puffs into the lungs every 6 (six) hours as needed for wheezing or shortness of breath.      apixaban (ELIQUIS) 5 MG TABS tablet Take 5 mg by mouth 2 (two) times daily.     aspirin  EC 81 MG tablet Take 81 mg by mouth daily. Swallow whole.     clopidogrel (PLAVIX) 75 MG tablet Take 75 mg by mouth daily.     fluticasone (FLONASE) 50 MCG/ACT nasal spray Place 2 sprays into both nostrils daily. Using as needed     lisinopril  (ZESTRIL ) 2.5 MG tablet Take 1 tablet (2.5 mg total) by mouth daily. 30 tablet 0   metoprolol  succinate (TOPROL -XL) 25 MG 24 hr tablet Take 1 tablet by mouth daily.     oxyCODONE (OXY IR/ROXICODONE) 5 MG immediate release tablet Take 5 mg by mouth every 6 (six) hours as needed.     pantoprazole (PROTONIX) 20 MG tablet Take 20 mg by mouth daily.     rosuvastatin  (CRESTOR ) 40 MG tablet Take 40 mg by mouth daily.     tamsulosin (FLOMAX) 0.4 MG CAPS capsule  Take 0.4 mg by mouth daily.     triamcinolone cream (KENALOG) 0.5 % Apply 1 application topically 2 (two) times daily as needed (psoriasis).     zolpidem  (AMBIEN  CR) 6.25 MG CR tablet Take 6.25 mg by mouth at bedtime as needed.     zolpidem  (AMBIEN ) 10 MG tablet Take 10 mg by mouth at bedtime as needed for sleep.   0   amiodarone  (PACERONE ) 200 MG tablet Take 200 mg by mouth daily. (Patient not taking: Reported on 08/05/2023)     colchicine  0.6 MG tablet Take 1 tablet (0.6 mg total) by mouth 2 (two) times daily. 60 tablet 0   No current facility-administered medications on file prior to visit.    There are no Patient Instructions on file for this visit. No follow-ups on file.   Blayze Haen E Sheridyn Canino, NP

## 2023-08-05 NOTE — Progress Notes (Signed)
 Cardiac Individual Treatment Plan  Patient Details  Name: Kurt Hammond MRN: [id number] Date of Birth: Jan 25, 1961 Referring Provider:   Flowsheet Row Cardiac Rehab from 06/22/2023 in [facility] Cardiac and Pulmonary Rehab  Referring Provider Dr. Burney Carter, MD       Initial Encounter Date:  Flowsheet Row Cardiac Rehab from 06/22/2023 in [facility] Cardiac and Pulmonary Rehab  Date 06/22/23       Visit Diagnosis: Status post coronary artery stent placement  Patient's Home Medications on Admission:  Current Outpatient Medications:    albuterol (VENTOLIN HFA) 108 (90 Base) MCG/ACT inhaler, Inhale 2 puffs into the lungs every 6 (six) hours as needed for wheezing or shortness of breath. , Disp: , Rfl:    amiodarone  (PACERONE ) 200 MG tablet, Take 200 mg by mouth daily. (Patient not taking: Reported on 06/18/2023), Disp: , Rfl:    apixaban (ELIQUIS) 5 MG TABS tablet, Take 5 mg by mouth 2 (two) times daily., Disp: , Rfl:    aspirin  EC 81 MG tablet, Take 81 mg by mouth daily. Swallow whole., Disp: , Rfl:    clopidogrel (PLAVIX) 75 MG tablet, Take 75 mg by mouth daily., Disp: , Rfl:    colchicine  0.6 MG tablet, Take 1 tablet (0.6 mg total) by mouth 2 (two) times daily., Disp: 60 tablet, Rfl: 0   fluticasone (FLONASE) 50 MCG/ACT nasal spray, Place 2 sprays into both nostrils daily. Using as needed, Disp: , Rfl:    lisinopril  (ZESTRIL ) 2.5 MG tablet, Take 1 tablet (2.5 mg total) by mouth daily., Disp: 30 tablet, Rfl: 0   metoprolol  succinate (TOPROL -XL) 25 MG 24 hr tablet, Take 1 tablet by mouth daily., Disp: , Rfl:    oxyCODONE (OXY IR/ROXICODONE) 5 MG immediate release tablet, Take 5 mg by mouth every 6 (six) hours as needed., Disp: , Rfl:    pantoprazole (PROTONIX) 20 MG tablet, Take 20 mg by mouth daily., Disp: , Rfl:    rosuvastatin  (CRESTOR ) 40 MG tablet, Take 40 mg by mouth daily., Disp: , Rfl:    tamsulosin (FLOMAX) 0.4 MG CAPS capsule, Take 0.4 mg by mouth daily., Disp: , Rfl:     triamcinolone cream (KENALOG) 0.5 %, Apply 1 application topically 2 (two) times daily as needed (psoriasis)., Disp: , Rfl:    zolpidem  (AMBIEN  CR) 6.25 MG CR tablet, Take 6.25 mg by mouth at bedtime as needed., Disp: , Rfl:    zolpidem  (AMBIEN ) 10 MG tablet, Take 10 mg by mouth at bedtime as needed for sleep. , Disp: , Rfl: 0  Past Medical History: Past Medical History:  Diagnosis Date   Hyperlipidemia    Hypertension    Sleep difficulties     Tobacco Use: Social History   Tobacco Use  Smoking Status Never  Smokeless Tobacco Never    Labs: Review Flowsheet        No data to display           Exercise Target Goals: Exercise Program Goal: Individual exercise prescription set using results from initial 6 min walk test and THRR while considering  patient's activity barriers and safety.   Exercise Prescription Goal: Initial exercise prescription builds to 30-45 minutes a day of aerobic activity, 2-3 days per week.  Home exercise guidelines will be given to patient during program as part of exercise prescription that the participant will acknowledge.   Education: Aerobic Exercise: - Group verbal and visual presentation on the components of exercise prescription. Introduces F.I.T.T principle from ACSM for exercise prescriptions.  Reviews F.I.T.T. principles of aerobic exercise including progression. Written material given at graduation.   Education: Resistance Exercise: - Group verbal and visual presentation on the components of exercise prescription. Introduces F.I.T.T principle from ACSM for exercise prescriptions  Reviews F.I.T.T. principles of resistance exercise including progression. Written material given at graduation.    Education: Exercise & Equipment Safety: - Individual verbal instruction and demonstration of equipment use and safety with use of the equipment. Flowsheet Row Cardiac Rehab from 08/05/2023 in [facility] Cardiac and Pulmonary Rehab  Date 06/22/23   Educator NT  Instruction Review Code 1- Verbalizes Understanding       Education: Exercise Physiology & General Exercise Guidelines: - Group verbal and written instruction with models to review the exercise physiology of the cardiovascular system and associated critical values. Provides general exercise guidelines with specific guidelines to those with heart or lung disease.    Education: Flexibility, Balance, Mind/Body Relaxation: - Group verbal and visual presentation with interactive activity on the components of exercise prescription. Introduces F.I.T.T principle from ACSM for exercise prescriptions. Reviews F.I.T.T. principles of flexibility and balance exercise training including progression. Also discusses the mind body connection.  Reviews various relaxation techniques to help reduce and manage stress (i.e. Deep breathing, progressive muscle relaxation, and visualization). Balance handout provided to take home. Written material given at graduation.   Activity Barriers & Risk Stratification:  Activity Barriers & Cardiac Risk Stratification - 06/22/23 1026       Activity Barriers & Cardiac Risk Stratification   Activity Barriers Shortness of Breath;Other (comment)    Comments Hx of rotator cuff surgery, degenerative joint disease    Cardiac Risk Stratification Moderate             6 Minute Walk:  6 Minute Walk     Row Name 06/22/23 1024         6 Minute Walk   Phase Initial     Distance 1410 feet     Walk Time 6 minutes     # of Rest Breaks 0     MPH 2.67     METS 3.16     RPE 9     Perceived Dyspnea  0     VO2 Peak 11.05     Symptoms No     Resting HR 65 bpm     Resting BP 106/64     Resting Oxygen Saturation  97 %     Exercise Oxygen Saturation  during 6 min walk 96 %     Max Ex. HR 91 bpm     Max Ex. BP 122/68     2 Minute Post BP 108/66              Oxygen Initial Assessment:   Oxygen Re-Evaluation:   Oxygen Discharge (Final Oxygen  Re-Evaluation):   Initial Exercise Prescription:  Initial Exercise Prescription - 06/22/23 1000       Date of Initial Exercise RX and Referring Provider   Date 06/22/23    Referring Provider Dr. Burney Carter, MD      Oxygen   Maintain Oxygen Saturation 88% or higher      Treadmill   MPH 2.6    Grade 1    Minutes 15    METs 3.3      NuStep   Level 3    SPM 80    Minutes 15    METs 3.16      REL-XR   Level 3    Speed 50  Minutes 15    METs 3.16      Prescription Details   Frequency (times per week) 3    Duration Progress to 30 minutes of continuous aerobic without signs/symptoms of physical distress      Intensity   THRR 40-80% of Max Heartrate 103-141    Ratings of Perceived Exertion 11-13    Perceived Dyspnea 0-4      Progression   Progression Continue to progress workloads to maintain intensity without signs/symptoms of physical distress.      Resistance Training   Training Prescription Yes    Weight 7 lb    Reps 10-15             Perform Capillary Blood Glucose checks as needed.  Exercise Prescription Changes:   Exercise Prescription Changes     Row Name 06/22/23 1000 07/09/23 1500 07/23/23 1800 07/27/23 0700       Response to Exercise   Blood Pressure (Admit) 106/64 120/62 124/77 --    Blood Pressure (Exercise) 122/68 134/66 150/70 --    Blood Pressure (Exit) 108/66 102/60 128/64 --    Heart Rate (Admit) 65 bpm 79 bpm 70 bpm --    Heart Rate (Exercise) 91 bpm 150 bpm 138 bpm --    Heart Rate (Exit) 64 bpm 80 bpm 76 bpm --    Oxygen Saturation (Admit) 97 % -- -- --    Oxygen Saturation (Exercise) 96 % -- -- --    Rating of Perceived Exertion (Exercise) 9 14 13  --    Perceived Dyspnea (Exercise) 0 -- -- --    Symptoms none none none --    Comments Results First two weeks of exercise -- --    Duration -- Continue with 30 min of aerobic exercise without signs/symptoms of physical distress. Continue with 30 min of aerobic exercise  without signs/symptoms of physical distress. Continue with 30 min of aerobic exercise without signs/symptoms of physical distress.    Intensity -- THRR unchanged THRR unchanged THRR unchanged      Progression   Progression -- Continue to progress workloads to maintain intensity without signs/symptoms of physical distress. Continue to progress workloads to maintain intensity without signs/symptoms of physical distress. Continue to progress workloads to maintain intensity without signs/symptoms of physical distress.    Average METs -- 3.3 3.56 3.56      Resistance Training   Training Prescription -- Yes Yes Yes    Weight -- 7 lb 10 lb 10 lb    Reps -- 10-15 10-15 10-15      Interval Training   Interval Training -- No No No      Treadmill   MPH -- 2.8 3 3     Grade -- 1 2 2     Minutes -- 15 15 15     METs -- 3.53 4.12 4.12      NuStep   Level -- 4 4 4     Minutes -- 15 15 15     METs -- 3 3.7 3.7      REL-XR   Level -- 4 4 4     Minutes -- 15 15 15     METs -- 3.8 4 4       T5 Nustep   Level -- 3 3 3     Minutes -- 15 15 15     METs -- 3.3 2.7 2.7      Home Exercise Plan   Plans to continue exercise at -- -- -- [employer] (comment)  gym where his  son works    Frequency -- -- -- Add 2 additional days to program exercise sessions.    Initial Home Exercises Provided -- -- -- 07/27/23      Oxygen   Maintain Oxygen Saturation -- 88% or higher 88% or higher 88% or higher             Exercise Comments:   Exercise Comments     Row Name 06/24/23 0830           Exercise Comments First full day of exercise!  Patient was oriented to gym and equipment including functions, settings, policies, and procedures.  Patient's individual exercise prescription and treatment plan were reviewed.  All starting workloads were established based on the results of the 6 minute walk test done at initial orientation visit.  The plan for exercise progression was also introduced and progression  will be customized based on patient's performance and goals.                Exercise Goals and Review:   Exercise Goals     Row Name 06/22/23 1026             Exercise Goals   Increase Physical Activity Yes       Intervention Provide advice, education, support and counseling about physical activity/exercise needs.;Develop an individualized exercise prescription for aerobic and resistive training based on initial evaluation findings, risk stratification, comorbidities and participant's personal goals.       Expected Outcomes Short Term: Attend rehab on a regular basis to increase amount of physical activity.;Long Term: Add in home exercise to make exercise part of routine and to increase amount of physical activity.;Long Term: Exercising regularly at least 3-5 days a week.       Increase Strength and Stamina Yes       Intervention Provide advice, education, support and counseling about physical activity/exercise needs.;Develop an individualized exercise prescription for aerobic and resistive training based on initial evaluation findings, risk stratification, comorbidities and participant's personal goals.       Expected Outcomes Short Term: Increase workloads from initial exercise prescription for resistance, speed, and METs.;Long Term: Improve cardiorespiratory fitness, muscular endurance and strength as measured by increased METs and functional capacity ( );Short Term: Perform resistance training exercises routinely during rehab and add in resistance training at home       Able to understand and use rate of perceived exertion (RPE) scale Yes       Intervention Provide education and explanation on how to use RPE scale       Expected Outcomes Short Term: Able to use RPE daily in rehab to express subjective intensity level;Long Term:  Able to use RPE to guide intensity level when exercising independently       Able to understand and use Dyspnea scale Yes       Intervention Provide  education and explanation on how to use Dyspnea scale       Expected Outcomes Long Term: Able to use Dyspnea scale to guide intensity level when exercising independently;Short Term: Able to use Dyspnea scale daily in rehab to express subjective sense of shortness of breath during exertion       Knowledge and understanding of Target Heart Rate Range (THRR) Yes       Intervention Provide education and explanation of THRR including how the numbers were predicted and where they are located for reference       Expected Outcomes Short Term: Able to state/look up THRR;Long Term: Able  to use THRR to govern intensity when exercising independently;Short Term: Able to use daily as guideline for intensity in rehab       Able to check pulse independently Yes       Intervention Provide education and demonstration on how to check pulse in carotid and radial arteries.;Review the importance of being able to check your own pulse for safety during independent exercise       Expected Outcomes Short Term: Able to explain why pulse checking is important during independent exercise;Long Term: Able to check pulse independently and accurately       Understanding of Exercise Prescription Yes       Intervention Provide education, explanation, and written materials on patient's individual exercise prescription       Expected Outcomes Short Term: Able to explain program exercise prescription;Long Term: Able to explain home exercise prescription to exercise independently                Exercise Goals Re-Evaluation :  Exercise Goals Re-Evaluation     Row Name 06/24/23 0830 07/09/23 1530 07/23/23 1811 07/27/23 0748       Exercise Goal Re-Evaluation   Exercise Goals Review Able to understand and use rate of perceived exertion (RPE) scale;Able to understand and use Dyspnea scale;Knowledge and understanding of Target Heart Rate Range (THRR);Understanding of Exercise Prescription Increase Physical Activity;Increase Strength  and Stamina;Understanding of Exercise Prescription Increase Physical Activity;Increase Strength and Stamina;Understanding of Exercise Prescription Increase Physical Activity;Able to understand and use rate of perceived exertion (RPE) scale;Knowledge and understanding of Target Heart Rate Range (THRR);Understanding of Exercise Prescription;Increase Strength and Stamina;Able to understand and use Dyspnea scale;Able to check pulse independently    Comments Reviewed RPE and dyspnea scale, THR and program prescription with pt today.  Pt voiced understanding and was given a copy of goals to take home. Ikey is off to a good start in the program. He did well on the treadmill at a speed of 2.8 mph with an incline of 1%. He also improved to level 4 on both the T4 nustep and XR. We will continue to monitor his progress in the program. Agustin is doing well in the program. He increased his workload on the treadmill to a speed of and incline of 2%. He also increased to 10 lb weights for resistance. He maintained to level 4 on both the T4 nustep and XR. We will continue to monitor his progress in the program. Reviewed home exercise with pt today.  Pt plans to use a gym where his son works for exercise.  Reviewed THR, pulse, RPE, sign and symptoms, pulse oximetery and when to call 911 or MD.  Also discussed weather considerations and indoor options.  Pt voiced understanding.    Expected Outcomes Short: Use RPE daily to regulate intensity. Long: Follow program prescription in THR. Short: Continue to follow current exercise prescription. Long: Continue exercise to improve strength and stamina. Short: Continue to progressively increase workloads. Long: Continue exercise to improve strength and stamina. Short: add 1-2 days a week of exercise on off days of rehab. Long: maintain independent exercise routine upon graduation from cardiac rehab.             Discharge Exercise Prescription (Final Exercise Prescription  Changes):  Exercise Prescription Changes - 07/27/23 0700       Response to Exercise   Duration Continue with 30 min of aerobic exercise without signs/symptoms of physical distress.    Intensity THRR unchanged  Progression   Progression Continue to progress workloads to maintain intensity without signs/symptoms of physical distress.    Average METs 3.56      Resistance Training   Training Prescription Yes    Weight 10 lb    Reps 10-15      Interval Training   Interval Training No      Treadmill   MPH 3    Grade 2    Minutes 15    METs 4.12      NuStep   Level 4    Minutes 15    METs 3.7      REL-XR   Level 4    Minutes 15    METs 4      T5 Nustep   Level 3    Minutes 15    METs 2.7      Home Exercise Plan   Plans to continue exercise at [employer] (comment)   gym where his son works   Frequency Add 2 additional days to program exercise sessions.    Initial Home Exercises Provided 07/27/23      Oxygen   Maintain Oxygen Saturation 88% or higher             Nutrition:  Target Goals: Understanding of nutrition guidelines, daily intake of sodium 1500mg , cholesterol 200mg , calories 30% from fat and 7% or less from saturated fats, daily to have 5 or more servings of fruits and vegetables.  Education: All About Nutrition: -Group instruction provided by verbal, written material, interactive activities, discussions, models, and posters to present general guidelines for heart healthy nutrition including fat, fiber, MyPlate, the role of sodium in heart healthy nutrition, utilization of the nutrition label, and utilization of this knowledge for meal planning. Follow up email sent as well. Written material given at graduation. Flowsheet Row Cardiac Rehab from 08/05/2023 in [facility] Cardiac and Pulmonary Rehab  Education need identified 06/22/23       Biometrics:  Pre Biometrics - 06/22/23 1030       Pre Biometrics   Height 5' 10.25" (1.784 m)    Weight  235 lb 1.6 oz (106.6 kg)    Waist Circumference 42 inches    Hip Circumference 43 inches    Waist to Hip Ratio 0.98 %    BMI (Calculated) 33.51    Single Leg Stand 30 seconds              Nutrition Therapy Plan and Nutrition Goals:  Nutrition Therapy & Goals - 06/22/23 1532       Nutrition Therapy   Diet Cardiac, Low Na    Protein (specify units) 90    Fiber 30 grams    Whole Grain Foods 3 servings    Saturated Fats 15 max. grams    Fruits and Vegetables 5 servings/day    Sodium 2 grams      Personal Nutrition Goals   Nutrition Goal Read labels and reduce sodium intake to below 2300mg . Ideally 1500mg  per day.    Personal Goal #2 Reduce saturated fat, less than 12g per day. Replace bad fats for more heart healthy fats.    Personal Goal #3 Include more colorful produce, aim for 5-8 servings of fruits and veggies per day    Comments Patient drinking mostly water, around 64oz daily. Drinks 2 cups of coffee most mornings. He reports his Dr recommended he keep caffeine intake low. RD suggested as low as possible, but should be below 400mg  daily. Reviewed mediterranean diet handout.  Educated on types of fats, sources, how to read labels. Set goal to read labels and monitor sodium intake to less than 1500mg  daily. Encouraged him to reduce his saturated fat intake and provided target of less than 15g per day. Build out a few meals and snacks with food he likes and will eat, focusing on low calorie nutrient rich foods low in sodium and saturated fat.      Intervention Plan   Intervention Prescribe, educate and counsel regarding individualized specific dietary modifications aiming towards targeted core components such as weight, hypertension, lipid management, diabetes, heart failure and other comorbidities.;Nutrition handout(s) given to patient.    Expected Outcomes Short Term Goal: Understand basic principles of dietary content, such as calories, fat, sodium, cholesterol and  nutrients.;Short Term Goal: A plan has been developed with personal nutrition goals set during dietitian appointment.;Long Term Goal: Adherence to prescribed nutrition plan.             Nutrition Assessments:  MEDIFICTS Score Key: >=70 Need to make dietary changes  40-70 Heart Healthy Diet <= 40 Therapeutic Level Cholesterol Diet  Flowsheet Row Cardiac Rehab from 06/18/2023 in [facility] Cardiac and Pulmonary Rehab  Picture Your Plate Total Score on Admission 55      Picture Your Plate Scores: <40 Unhealthy dietary pattern with much room for improvement. 41-50 Dietary pattern unlikely to meet recommendations for good health and room for improvement. 51-60 More healthful dietary pattern, with some room for improvement.  >60 Healthy dietary pattern, although there may be some specific behaviors that could be improved.    Nutrition Goals Re-Evaluation:  Nutrition Goals Re-Evaluation     Row Name 07/22/23 0741             Goals   Current Weight 228 lb 6.4 oz (103.6 kg)       Comment Kyo is doing well managing his diet. He is still trying to include more vegetables, and trying to cut back on red meat.       Expected Outcome Short: Continue to follow heart healthy diet. Long: Continue to work towards weight loss, and reduce sodium intake.                Nutrition Goals Discharge (Final Nutrition Goals Re-Evaluation):  Nutrition Goals Re-Evaluation - 07/22/23 0741       Goals   Current Weight 228 lb 6.4 oz (103.6 kg)    Comment Arthuro is doing well managing his diet. He is still trying to include more vegetables, and trying to cut back on red meat.    Expected Outcome Short: Continue to follow heart healthy diet. Long: Continue to work towards weight loss, and reduce sodium intake.             Psychosocial: Target Goals: Acknowledge presence or absence of significant depression and/or stress, maximize coping skills, provide positive support system. Participant is  able to verbalize types and ability to use techniques and skills needed for reducing stress and depression.   Education: Stress, Anxiety, and Depression - Group verbal and visual presentation to define topics covered.  Reviews how body is impacted by stress, anxiety, and depression.  Also discusses healthy ways to reduce stress and to treat/manage anxiety and depression.  Written material given at graduation. Flowsheet Row Cardiac Rehab from 08/05/2023 in [facility] Cardiac and Pulmonary Rehab  Date 08/05/23  Educator SB  Instruction Review Code 1- [employer] Understanding       Education: Sleep Hygiene -Provides group verbal and written instruction  about how sleep can affect your health.  Define sleep hygiene, discuss sleep cycles and impact of sleep habits. Review good sleep hygiene tips.    Initial Review & Psychosocial Screening:  Initial Psych Review & Screening - 06/18/23 1356       Initial Review   Current issues with None Identified      Family Dynamics   Good Support System? Yes   wife, Burdette Carolin.     Screening Interventions   Interventions Encouraged to exercise;To provide support and resources with identified psychosocial needs;Provide feedback about the scores to participant    Expected Outcomes Short Term goal: Utilizing psychosocial counselor, staff and physician to assist with identification of specific Stressors or current issues interfering with healing process. Setting desired goal for each stressor or current issue identified.;Long Term Goal: Stressors or current issues are controlled or eliminated.;Short Term goal: Identification and review with participant of any Quality of Life or Depression concerns found by scoring the questionnaire.;Long Term goal: The participant improves quality of Life and PHQ9 Scores as seen by post scores and/or verbalization of changes             Quality of Life Scores:   Quality of Life - 06/18/23 1412       Quality of Life   Select Quality  of Life      Quality of Life Scores   Health/Function Pre 23.33 %    Socioeconomic Pre 22.06 %    Psych/Spiritual Pre 24.43 %    Family Pre 30 %    GLOBAL Pre 24.21 %            Scores of 19 and below usually indicate a poorer quality of life in these areas.  A difference of  2-3 points is a clinically meaningful difference.  A difference of 2-3 points in the total score of the Quality of Life Index has been associated with significant improvement in overall quality of life, self-image, physical symptoms, and general health in studies assessing change in quality of life.  PHQ-9: Review Flowsheet       06/22/2023  Depression screen PHQ 2/9  Decreased Interest 0  Down, Depressed, Hopeless 0  PHQ - 2 Score 0  Altered sleeping 2  Tired, decreased energy 0  Change in appetite 0  Feeling bad or failure about yourself  0  Trouble concentrating 0  Moving slowly or fidgety/restless 0  Suicidal thoughts 0  PHQ-9 Score 2  Difficult doing work/chores Not difficult at all   Interpretation of Total Score  Total Score Depression Severity:  1-4 = Minimal depression, 5-9 = Mild depression, 10-14 = Moderate depression, 15-19 = Moderately severe depression, 20-27 = Severe depression   Psychosocial Evaluation and Intervention:  Psychosocial Evaluation - 06/18/23 1403       Psychosocial Evaluation & Interventions   Interventions Encouraged to exercise with the program and follow exercise prescription    Comments No barriers to starting the program. Wants to lose weight, improve shortness of breath and learn heart healthy lifestyle steps.  He lives with his wife and she is his support.  He does have some sleep concerns that have been present for year or more. His pulmonary doctor is referring him to a Sleep doctor in [place].   He is waiting for that office to call him.   He should do well with the program    Expected Outcomes STG attends all scheduled sessions, meets with RD and follows  exercise progression while in the  program. Has appointment with a Sleep doctor.  LTG Continues with exercise progression, working on his weight loss and getting help with his sleep concerns after discharge    Continue Psychosocial Services  Follow up required by staff             Psychosocial Re-Evaluation:  Psychosocial Re-Evaluation     Row Name 07/22/23 [id number]             Psychosocial Re-Evaluation   Current issues with None Identified       Comments Edward is doing well. He reports having no stressors at this time. He cites his wife and his sons as his support system. Donquarius manages his stress through golfing with friends, and fishing.       Expected Outcomes Short: Continue to manage stress through healthy outlets. Long: Continue to exercise in and out of the program to manage stressors.       Continue Psychosocial Services  Follow up required by staff                Psychosocial Discharge (Final Psychosocial Re-Evaluation):  Psychosocial Re-Evaluation - 07/22/23 0736       Psychosocial Re-Evaluation   Current issues with None Identified    Comments Mazi is doing well. He reports having no stressors at this time. He cites his wife and his sons as his support system. Brycin manages his stress through golfing with friends, and fishing.    Expected Outcomes Short: Continue to manage stress through healthy outlets. Long: Continue to exercise in and out of the program to manage stressors.    Continue Psychosocial Services  Follow up required by staff             Vocational Rehabilitation: Provide vocational rehab assistance to qualifying candidates.   Vocational Rehab Evaluation & Intervention:  Vocational Rehab - 06/18/23 1359       Initial Vocational Rehab Evaluation & Intervention   Assessment shows need for Vocational Rehabilitation No      Vocational Rehab Re-Evaulation   Comments back at his job             Education: Education Goals: Education  classes will be provided on a variety of topics geared toward better understanding of heart health and risk factor modification. Participant will state understanding/return demonstration of topics presented as noted by education test scores.  Learning Barriers/Preferences:   General Cardiac Education Topics:  AED/CPR: - Group verbal and written instruction with the use of models to demonstrate the basic use of the AED with the basic ABC's of resuscitation.   Anatomy and Cardiac Procedures: - Group verbal and visual presentation and models provide information about basic cardiac anatomy and function. Reviews the testing methods done to diagnose heart disease and the outcomes of the test results. Describes the treatment choices: Medical Management, Angioplasty, or Coronary Bypass Surgery for treating various heart conditions including Myocardial Infarction, Angina, Valve Disease, and Cardiac Arrhythmias.  Written material given at graduation. Flowsheet Row Cardiac Rehab from 08/05/2023 in [facility] Cardiac and Pulmonary Rehab  Education need identified 06/22/23  Date 07/08/23  Educator sb  Instruction Review Code 1- Verbalizes Understanding       Medication Safety: - Group verbal and visual instruction to review commonly prescribed medications for heart and lung disease. Reviews the medication, class of the drug, and side effects. Includes the steps to properly store meds and maintain the prescription regimen.  Written material given at graduation. Flowsheet Row Cardiac Rehab from 08/05/2023 in  [facility] Cardiac and Pulmonary Rehab  Date 07/15/23  Educator SB  Instruction Review Code 1- Verbalizes Understanding       Intimacy: - Group verbal instruction through game format to discuss how heart and lung disease can affect sexual intimacy. Written material given at graduation..   Know Your Numbers and Heart Failure: - Group verbal and visual instruction to discuss disease risk factors for cardiac  and pulmonary disease and treatment options.  Reviews associated critical values for Overweight/Obesity, Hypertension, Cholesterol, and Diabetes.  Discusses basics of heart failure: signs/symptoms and treatments.  Introduces Heart Failure Zone chart for action plan for heart failure.  Written material given at graduation. Flowsheet Row Cardiac Rehab from 08/05/2023 in [facility] Cardiac and Pulmonary Rehab  Date 07/22/23  Educator SB  Instruction Review Code 1- Verbalizes Understanding       Infection Prevention: - Provides verbal and written material to individual with discussion of infection control including proper hand washing and proper equipment cleaning during exercise session. Flowsheet Row Cardiac Rehab from 08/05/2023 in [facility] Cardiac and Pulmonary Rehab  Date 06/22/23  Educator NT  Instruction Review Code 1- Verbalizes Understanding       Falls Prevention: - Provides verbal and written material to individual with discussion of falls prevention and safety. Flowsheet Row Cardiac Rehab from 08/05/2023 in [facility] Cardiac and Pulmonary Rehab  Date 06/18/23  Educator SB  Instruction Review Code 1- Verbalizes Understanding       Other: -Provides group and verbal instruction on various topics (see comments)   Knowledge Questionnaire Score:  Knowledge Questionnaire Score - 06/18/23 1412       Knowledge Questionnaire Score   Pre Score 23/26             Core Components/Risk Factors/Patient Goals at Admission:  Personal Goals and Risk Factors at Admission - 06/18/23 1359       Core Components/Risk Factors/Patient Goals on Admission    Weight Management Yes    Intervention Weight Management: Develop a combined nutrition and exercise program designed to reach desired caloric intake, while maintaining appropriate intake of nutrient and fiber, sodium and fats, and appropriate energy expenditure required for the weight goal.;Weight Management/Obesity: Establish reasonable short term and  long term weight goals.    Admit Weight 230 lb (104.3 kg)    Goal Weight: Short Term 228 lb (103.4 kg)    Goal Weight: Long Term 210 lb (95.3 kg)    Expected Outcomes Long Term: Adherence to nutrition and physical activity/exercise program aimed toward attainment of established weight goal;Short Term: Continue to assess and modify interventions until short term weight is achieved;Weight Loss: Understanding of general recommendations for a balanced deficit meal plan, which promotes 1-2 lb weight loss per week and includes a negative energy balance of [phone] kcal/d    Lipids Yes    Intervention Provide education and support for participant on nutrition & aerobic/resistive exercise along with prescribed medications to achieve LDL 70mg , HDL >40mg .    Expected Outcomes Short Term: Participant states understanding of desired cholesterol values and is compliant with medications prescribed. Participant is following exercise prescription and nutrition guidelines.;Long Term: Cholesterol controlled with medications as prescribed, with individualized exercise RX and with personalized nutrition plan. Value goals: LDL < 70mg , HDL > 40 mg.             Education:Diabetes - Individual verbal and written instruction to review signs/symptoms of diabetes, desired ranges of glucose level fasting, after meals and with exercise. Acknowledge that pre and post  exercise glucose checks will be done for 3 sessions at entry of program.   Core Components/Risk Factors/Patient Goals Review:   Goals and Risk Factor Review     Row Name 07/22/23 0746             Core Components/Risk Factors/Patient Goals Review   Personal Goals Review Weight Management/Obesity       Review Jabrill is still trying to lose some weight. He ideally wants to be around the 210lb mark, but he has lost a few pounds and that seems to be encouraging.       Expected Outcomes Short: Continue to follow a heart healthy diet combined with exercise in  order to lose weight. Long: Continue to exercise at home, and in the program to attain weight goal of 210lb.                Core Components/Risk Factors/Patient Goals at Discharge (Final Review):   Goals and Risk Factor Review - 07/22/23 0746       Core Components/Risk Factors/Patient Goals Review   Personal Goals Review Weight Management/Obesity    Review Kovin is still trying to lose some weight. He ideally wants to be around the 210lb mark, but he has lost a few pounds and that seems to be encouraging.    Expected Outcomes Short: Continue to follow a heart healthy diet combined with exercise in order to lose weight. Long: Continue to exercise at home, and in the program to attain weight goal of 210lb.             ITP Comments:  ITP Comments     Row Name 06/18/23 1410 06/22/23 1023 06/24/23 0830 07/08/23 1159 07/08/23 1200   ITP Comments Virtual orientation call completed today. he has an appointment on Date: 06/22/2023  for EP eval and gym Orientation.  Documentation of diagnosis [facility] be found in [facility] 06/09/2023 . Completed and gym orientation. Initial ITP created and sent for review to Dr. Firman Hughes, Medical Director. First full day of exercise!  Patient was oriented to gym and equipment including functions, settings, policies, and procedures.  Patient's individual exercise prescription and treatment plan were reviewed.  All starting workloads were established based on the results of the 6 minute walk test done at initial orientation visit.  The plan for exercise progression was also introduced and progression will be customized based on patient's performance and goals. 30 Day review completed. Medical Director ITP review done, changes made as directed, and signed approval by Medical Director.    new to program 30 Day review completed. Medical Director ITP review done, changes made as directed, and signed approval by Medical Director.    Row Name 08/05/23 1318           ITP  Comments 30 Day review completed. Medical Director ITP review done, changes made as directed, and signed approval by Medical Director.                Comments: 30 day review

## 2023-08-05 NOTE — Progress Notes (Signed)
 Daily Session Note  Patient Details  Name: Kurt Hammond MRN: [id number] Date of Birth: 01/14/1961 Referring Provider:   Flowsheet Row Cardiac Rehab from 06/22/2023 in [facility] Cardiac and Pulmonary Rehab  Referring Provider Dr. Burney Carter, MD       Encounter Date: 08/05/2023  Check In:  Session Check In - 08/05/23 0754       Check-In   Supervising physician immediately available to respond to emergencies See telemetry face sheet for immediately available ER MD    Location [facility]-Cardiac & Pulmonary Rehab    Staff Present Maud Sorenson, RN, BSN, CCRP;Joseph Hood RCP,RRT,BSRT;Maxon [place] BS, Exercise Physiologist;Laureen Oxford, BS, RRT, CPFT    Virtual Visit No    Medication changes reported     No    Fall or balance concerns reported    No    Warm-up and Cool-down Performed on first and last piece of equipment    Resistance Training Performed Yes    VAD Patient? No    PAD/SET Patient? No      Pain Assessment   Currently in Pain? No/denies                Social History   Tobacco Use  Smoking Status Never  Smokeless Tobacco Never    Goals Met:  Independence with exercise equipment Exercise tolerated well No report of concerns or symptoms today  Goals Unmet:  Not Applicable  Comments: Pt able to follow exercise prescription today without complaint.  Will continue to monitor for progression.    Dr. Firman Hughes is Medical Director for [facility] Cardiac Rehabilitation.  Dr. Fuad Aleskerov is Medical Director for [facility] Pulmonary Rehabilitation.

## 2023-08-05 NOTE — Progress Notes (Signed)
 30 Day review completed. Medical Director ITP review done, changes made as directed, and signed approval by Medical Director. ? ?

## 2023-08-06 ENCOUNTER — Encounter

## 2023-08-06 DIAGNOSIS — Z955 Presence of coronary angioplasty implant and graft: Secondary | ICD-10-CM

## 2023-08-06 NOTE — Progress Notes (Signed)
 Daily Session Note  Patient Details  Name: Kurt Hammond MRN: [id number] Date of Birth: 02-Mar-1961 Referring Provider:   Flowsheet Row Cardiac Rehab from 06/22/2023 in [facility] Cardiac and Pulmonary Rehab  Referring Provider Dr. Burney Carter, MD       Encounter Date: 08/06/2023  Check In:  Session Check In - 08/06/23 0752       Check-In   Supervising physician immediately available to respond to emergencies See telemetry face sheet for immediately available ER MD    Location [facility]-Cardiac & Pulmonary Rehab    Staff Present Maud Sorenson, RN, BSN, CCRP;Joseph Hood RCP,RRT,BSRT;Noah Tickle, [place], Exercise Physiologist;Jason Martina Sledge RDN,LDN    Virtual Visit No    Medication changes reported     No    Fall or balance concerns reported    No    Warm-up and Cool-down Performed on first and last piece of equipment    Resistance Training Performed Yes    VAD Patient? No    PAD/SET Patient? No      Pain Assessment   Currently in Pain? No/denies                Social History   Tobacco Use  Smoking Status Never  Smokeless Tobacco Never    Goals Met:  Independence with exercise equipment Exercise tolerated well No report of concerns or symptoms today  Goals Unmet:  Not Applicable  Comments: Pt able to follow exercise prescription today without complaint.  Will continue to monitor for progression.    Dr. Firman Hughes is Medical Director for [facility] Cardiac Rehabilitation.  Dr. Fuad Aleskerov is Medical Director for [facility] Pulmonary Rehabilitation.

## 2023-08-07 ENCOUNTER — Encounter

## 2023-08-10 ENCOUNTER — Encounter: Admitting: *Deleted

## 2023-08-10 DIAGNOSIS — Z955 Presence of coronary angioplasty implant and graft: Secondary | ICD-10-CM | POA: Diagnosis not present

## 2023-08-10 NOTE — Progress Notes (Signed)
 Daily Session Note  Patient Details  Name: Kurt Hammond MRN: [id number] Date of Birth: Apr 18, 1960 Referring Provider:   Flowsheet Row Cardiac Rehab from 06/22/2023 in [facility] Cardiac and Pulmonary Rehab  Referring Provider Dr. Burney Carter, MD       Encounter Date: 08/10/2023  Check In:  Session Check In - 08/10/23 0754       Check-In   Supervising physician immediately available to respond to emergencies See telemetry face sheet for immediately available ER MD    Location [facility]-Cardiac & Pulmonary Rehab    Staff Present Maud Sorenson, RN, BSN, CCRP;Joseph Hood RCP,RRT,BSRT;Kelly Verandah BS, ACSM CEP, Exercise Physiologist;Jason Martina Sledge RDN,LDN    Virtual Visit No    Medication changes reported     No    Fall or balance concerns reported    No    Warm-up and Cool-down Performed on first and last piece of equipment    Resistance Training Performed Yes    VAD Patient? No    PAD/SET Patient? No      Pain Assessment   Currently in Pain? No/denies                Social History   Tobacco Use  Smoking Status Never  Smokeless Tobacco Never    Goals Met:  Independence with exercise equipment Exercise tolerated well No report of concerns or symptoms today  Goals Unmet:  Not Applicable  Comments: Pt able to follow exercise prescription today without complaint.  Will continue to monitor for progression.    Dr. Firman Hughes is Medical Director for [facility] Cardiac Rehabilitation.  Dr. Fuad Aleskerov is Medical Director for [facility] Pulmonary Rehabilitation.

## 2023-08-12 ENCOUNTER — Encounter: Admitting: *Deleted

## 2023-08-12 DIAGNOSIS — Z955 Presence of coronary angioplasty implant and graft: Secondary | ICD-10-CM | POA: Diagnosis not present

## 2023-08-12 NOTE — Progress Notes (Signed)
 Daily Session Note  Patient Details  Name: Kurt Hammond MRN: [id number] Date of Birth: Mar 15, 1961 Referring Provider:   Flowsheet Row Cardiac Rehab from 06/22/2023 in [facility] Cardiac and Pulmonary Rehab  Referring Provider Dr. Burney Carter, MD       Encounter Date: 08/12/2023  Check In:  Session Check In - 08/12/23 0752       Check-In   Supervising physician immediately available to respond to emergencies See telemetry face sheet for immediately available ER MD    Location [facility]-Cardiac & Pulmonary Rehab    Staff Present Maud Sorenson, RN, BSN, CCRP;Maxon [place] BS, Exercise Physiologist;Noah Tickle, BS, Exercise Physiologist;Joseph Hood RCP,RRT,BSRT    Virtual Visit No    Medication changes reported     No    Fall or balance concerns reported    No    Warm-up and Cool-down Performed on first and last piece of equipment    Resistance Training Performed Yes    VAD Patient? No    PAD/SET Patient? No      Pain Assessment   Currently in Pain? No/denies                Social History   Tobacco Use  Smoking Status Never  Smokeless Tobacco Never    Goals Met:  Independence with exercise equipment Exercise tolerated well No report of concerns or symptoms today  Goals Unmet:  Not Applicable  Comments: Pt able to follow exercise prescription today without complaint.  Will continue to monitor for progression.    Dr. Firman Hughes is Medical Director for [facility] Cardiac Rehabilitation.  Dr. Fuad Aleskerov is Medical Director for [facility] Pulmonary Rehabilitation.

## 2023-08-14 ENCOUNTER — Encounter: Admitting: *Deleted

## 2023-08-14 VITALS — Ht 70.25 in | Wt 227.0 lb

## 2023-08-14 DIAGNOSIS — Z955 Presence of coronary angioplasty implant and graft: Secondary | ICD-10-CM | POA: Diagnosis not present

## 2023-08-14 NOTE — Patient Instructions (Signed)
 Discharge Patient Instructions  Patient Details  Name: Kurt Hammond MRN: [id number] Date of Birth: Aug 15, 1960 Referring Provider:  Lorrie Rothman, MD   Number of Visits: 98  Reason for Discharge:  Patient reached a stable level of exercise. Patient independent in their exercise. Patient has met program and personal goals.  Diagnosis:  Status post coronary artery stent placement  Initial Exercise Prescription:  Initial Exercise Prescription - 06/22/23 1000       Date of Initial Exercise RX and Referring Provider   Date 06/22/23    Referring Provider Dr. Burney Carter, MD      Oxygen   Maintain Oxygen Saturation 88% or higher      Treadmill   MPH 2.6    Grade 1    Minutes 15    METs 3.3      NuStep   Level 3    SPM 80    Minutes 15    METs 3.16      REL-XR   Level 3    Speed 50    Minutes 15    METs 3.16      Prescription Details   Frequency (times per week) 3    Duration Progress to 30 minutes of continuous aerobic without signs/symptoms of physical distress      Intensity   THRR 40-80% of Max Heartrate 103-141    Ratings of Perceived Exertion 11-13    Perceived Dyspnea 0-4      Progression   Progression Continue to progress workloads to maintain intensity without signs/symptoms of physical distress.      Resistance Training   Training Prescription Yes    Weight 7 lb    Reps 10-15             Discharge Exercise Prescription (Final Exercise Prescription Changes):  Exercise Prescription Changes - 08/06/23 1300       Response to Exercise   Blood Pressure (Admit) 114/66    Blood Pressure (Exit) 100/62    Heart Rate (Admit) 77 bpm    Heart Rate (Exercise) 89 bpm    Heart Rate (Exit) 59 bpm    Rating of Perceived Exertion (Exercise) 14    Symptoms none    Duration Continue with 30 min of aerobic exercise without signs/symptoms of physical distress.    Intensity THRR unchanged      Progression   Progression Continue to progress  workloads to maintain intensity without signs/symptoms of physical distress.    Average METs 4.01      Resistance Training   Training Prescription Yes    Weight 10 lb    Reps 10-15      Interval Training   Interval Training No      Treadmill   MPH 3    Grade 2    Minutes 15    METs 4.12      NuStep   Level 3    Minutes 15    METs 2.1      REL-XR   Level 5    Minutes 15    METs 4.5      Rower   Level 6    Watts 44    Minutes 15    METs 4.67      Home Exercise Plan   Plans to continue exercise at [employer] (comment)   gym where his son works   Frequency Add 2 additional days to program exercise sessions.    Initial Home Exercises Provided 07/27/23  Oxygen   Maintain Oxygen Saturation 88% or higher             Functional Capacity:  6 Minute Walk     Row Name 06/22/23 1024 08/14/23 0749       6 Minute Walk   Phase Initial Discharge    Distance 1410 feet 1615 feet    Distance % Change -- 14.5 %    Distance Feet Change -- 205 ft    Walk Time 6 minutes 6 minutes    # of Rest Breaks 0 0    MPH 2.67 3.06    METS 3.16 3.36    RPE 9 11    Perceived Dyspnea  0 0    VO2 Peak 11.05 11.75    Symptoms No No    Resting HR 65 bpm 58 bpm    Resting BP 106/64 110/60    Resting Oxygen Saturation  97 % 94 %    Exercise Oxygen Saturation  during 6 min walk 96 % 95 %    Max Ex. HR 91 bpm 70 bpm    Max Ex. BP 122/68 114/62    2 Minute Post BP 108/66 --            Nutrition & Weight - Outcomes:  Pre Biometrics - 06/22/23 1030       Pre Biometrics   Height 5' 10.25" (1.784 m)    Weight 235 lb 1.6 oz (106.6 kg)    Waist Circumference 42 inches    Hip Circumference 43 inches    Waist to Hip Ratio 0.98 %    BMI (Calculated) 33.51    Single Leg Stand 30 seconds             Post Biometrics - 08/14/23 0752        Post  Biometrics   Height 5' 10.25" (1.784 m)    Weight 227 lb (103 kg)    Waist Circumference 42 inches    Hip  Circumference 43 inches    Waist to Hip Ratio 0.98 %    BMI (Calculated) 32.35    Single Leg Stand 30 seconds             Nutrition:  Nutrition Therapy & Goals - 06/22/23 1532       Nutrition Therapy   Diet Cardiac, Low Na    Protein (specify units) 90    Fiber 30 grams    Whole Grain Foods 3 servings    Saturated Fats 15 max. grams    Fruits and Vegetables 5 servings/day    Sodium 2 grams      Personal Nutrition Goals   Nutrition Goal Read labels and reduce sodium intake to below 2300mg . Ideally 1500mg  per day.    Personal Goal #2 Reduce saturated fat, less than 12g per day. Replace bad fats for more heart healthy fats.    Personal Goal #3 Include more colorful produce, aim for 5-8 servings of fruits and veggies per day    Comments Patient drinking mostly water, around 64oz daily. Drinks 2 cups of coffee most mornings. He reports his Dr recommended he keep caffeine intake low. RD suggested as low as possible, but should be below 400mg  daily. Reviewed mediterranean diet handout. Educated on types of fats, sources, how to read labels. Set goal to read labels and monitor sodium intake to less than 1500mg  daily. Encouraged him to reduce his saturated fat intake and provided target of less than 15g per day. Build out a  few meals and snacks with food he likes and will eat, focusing on low calorie nutrient rich foods low in sodium and saturated fat.      Intervention Plan   Intervention Prescribe, educate and counsel regarding individualized specific dietary modifications aiming towards targeted core components such as weight, hypertension, lipid management, diabetes, heart failure and other comorbidities.;Nutrition handout(s) given to patient.    Expected Outcomes Short Term Goal: Understand basic principles of dietary content, such as calories, fat, sodium, cholesterol and nutrients.;Short Term Goal: A plan has been developed with personal nutrition goals set during dietitian  appointment.;Long Term Goal: Adherence to prescribed nutrition plan.

## 2023-08-14 NOTE — Progress Notes (Signed)
 Daily Session Note  Patient Details  Name: Kurt Hammond MRN: [id number] Date of Birth: 1960-09-06 Referring Provider:   Flowsheet Row Cardiac Rehab from 06/22/2023 in [facility] Cardiac and Pulmonary Rehab  Referring Provider Dr. Burney Carter, MD       Encounter Date: 08/14/2023  Check In:  Session Check In - 08/14/23 0751       Check-In   Supervising physician immediately available to respond to emergencies See telemetry face sheet for immediately available ER MD    Location [facility]-Cardiac & Pulmonary Rehab    Staff Present Maud Sorenson, RN, BSN, CCRP;Joseph Hood RCP,RRT,BSRT;Noah Tickle, [place], Exercise Physiologist    Virtual Visit No    Medication changes reported     No    Fall or balance concerns reported    No    Warm-up and Cool-down Performed on first and last piece of equipment    Resistance Training Performed Yes    VAD Patient? No    PAD/SET Patient? No      Pain Assessment   Currently in Pain? No/denies                Social History   Tobacco Use  Smoking Status Never  Smokeless Tobacco Never    Goals Met:  Independence with exercise equipment Exercise tolerated well No report of concerns or symptoms today  Goals Unmet:  Not Applicable  Comments: Pt able to follow exercise prescription today without complaint.  Will continue to monitor for progression.    Dr. Firman Hughes is Medical Director for [facility] Cardiac Rehabilitation.  Dr. Fuad Aleskerov is Medical Director for [facility] Pulmonary Rehabilitation.

## 2023-08-17 ENCOUNTER — Encounter: Admitting: *Deleted

## 2023-08-17 DIAGNOSIS — Z955 Presence of coronary angioplasty implant and graft: Secondary | ICD-10-CM

## 2023-08-17 NOTE — Progress Notes (Signed)
 Daily Session Note  Patient Details  Name: Kurt Hammond MRN: [id number] Date of Birth: 03-24-1961 Referring Provider:   Flowsheet Row Cardiac Rehab from 06/22/2023 in [facility] Cardiac and Pulmonary Rehab  Referring Provider Dr. Burney Carter, MD       Encounter Date: 08/17/2023  Check In:  Session Check In - 08/17/23 0801       Check-In   Supervising physician immediately available to respond to emergencies See telemetry face sheet for immediately available ER MD    Location [facility]-Cardiac & Pulmonary Rehab    Staff Present Maud Sorenson, RN, BSN, CCRP;Kelly Hayes BS, ACSM CEP, Exercise Physiologist;Jason Martina Sledge RDN,LDN;Noah Tickle, BS, Exercise Physiologist    Virtual Visit No    Medication changes reported     No    Fall or balance concerns reported    No    Warm-up and Cool-down Performed on first and last piece of equipment    Resistance Training Performed Yes    VAD Patient? No    PAD/SET Patient? No      Pain Assessment   Currently in Pain? No/denies                Social History   Tobacco Use  Smoking Status Never  Smokeless Tobacco Never    Goals Met:  Independence with exercise equipment Exercise tolerated well No report of concerns or symptoms today  Goals Unmet:  Not Applicable  Comments: Pt able to follow exercise prescription today without complaint.  Will continue to monitor for progression.    Dr. Firman Hughes is Medical Director for [facility] Cardiac Rehabilitation.  Dr. Fuad Aleskerov is Medical Director for [facility] Pulmonary Rehabilitation.

## 2023-08-18 ENCOUNTER — Encounter (INDEPENDENT_AMBULATORY_CARE_PROVIDER_SITE_OTHER): Payer: Self-pay

## 2023-08-19 ENCOUNTER — Encounter: Admitting: *Deleted

## 2023-08-19 DIAGNOSIS — Z955 Presence of coronary angioplasty implant and graft: Secondary | ICD-10-CM | POA: Diagnosis not present

## 2023-08-19 NOTE — Progress Notes (Signed)
 Daily Session Note  Patient Details  Name: Kurt Hammond MRN: [id number] Date of Birth: 01-Feb-1961 Referring Provider:   Flowsheet Row Cardiac Rehab from 06/22/2023 in [facility] Cardiac and Pulmonary Rehab  Referring Provider Dr. Burney Carter, MD       Encounter Date: 08/19/2023  Check In:  Session Check In - 08/19/23 0751       Check-In   Supervising physician immediately available to respond to emergencies See telemetry face sheet for immediately available ER MD    Location [facility]-Cardiac & Pulmonary Rehab    Staff Present Maud Sorenson, RN, BSN, CCRP;Noah Tickle, BS, Exercise Physiologist;Maxon Conetta BS, Exercise Physiologist;Jason Martina Sledge RDN,LDN    Virtual Visit No    Medication changes reported     No    Fall or balance concerns reported    No    Warm-up and Cool-down Performed on first and last piece of equipment    Resistance Training Performed Yes    VAD Patient? No    PAD/SET Patient? No      Pain Assessment   Currently in Pain? No/denies                Social History   Tobacco Use  Smoking Status Never  Smokeless Tobacco Never    Goals Met:  Independence with exercise equipment Exercise tolerated well No report of concerns or symptoms today  Goals Unmet:  Not Applicable  Comments: Pt able to follow exercise prescription today without complaint.  Will continue to monitor for progression.    Dr. Firman Hughes is Medical Director for [facility] Cardiac Rehabilitation.  Dr. Fuad Aleskerov is Medical Director for [facility] Pulmonary Rehabilitation.

## 2023-08-21 ENCOUNTER — Encounter: Admitting: *Deleted

## 2023-08-21 DIAGNOSIS — Z955 Presence of coronary angioplasty implant and graft: Secondary | ICD-10-CM

## 2023-08-21 NOTE — Progress Notes (Signed)
 Daily Session Note  Patient Details  Name: Kurt Hammond MRN: [id number] Date of Birth: 10/24/1960 Referring Provider:   Flowsheet Row Cardiac Rehab from 06/22/2023 in [facility] Cardiac and Pulmonary Rehab  Referring Provider Dr. Burney Carter, MD       Encounter Date: 08/21/2023  Check In:  Session Check In - 08/21/23 0744       Check-In   Supervising physician immediately available to respond to emergencies See telemetry face sheet for immediately available ER MD    Location [facility]-Cardiac & Pulmonary Rehab    Staff Present Maud Sorenson, RN, BSN, CCRP;Joseph Hood RCP,RRT,BSRT;Noah Tickle, [place], Exercise Physiologist    Virtual Visit No    Medication changes reported     No    Fall or balance concerns reported    No    Warm-up and Cool-down Performed on first and last piece of equipment    Resistance Training Performed Yes    VAD Patient? No    PAD/SET Patient? No      Pain Assessment   Currently in Pain? No/denies                Social History   Tobacco Use  Smoking Status Never  Smokeless Tobacco Never    Goals Met:  Independence with exercise equipment Exercise tolerated well No report of concerns or symptoms today  Goals Unmet:  Not Applicable  Comments: Pt able to follow exercise prescription today without complaint.  Will continue to monitor for progression.    Dr. Firman Hughes is Medical Director for [facility] Cardiac Rehabilitation.  Dr. Fuad Aleskerov is Medical Director for [facility] Pulmonary Rehabilitation.

## 2023-08-26 ENCOUNTER — Encounter: Admitting: *Deleted

## 2023-08-26 DIAGNOSIS — Z955 Presence of coronary angioplasty implant and graft: Secondary | ICD-10-CM | POA: Diagnosis not present

## 2023-08-26 NOTE — Progress Notes (Signed)
 Daily Session Note  Patient Details  Name: Kurt Hammond MRN: [id number] Date of Birth: [birth year] Referring Provider:   Flowsheet Row Cardiac Rehab from 06/22/2023 in [facility] Cardiac and Pulmonary Rehab  Referring Provider Dr. Burney Carter, MD       Encounter Date: 08/26/2023  Check In:  Session Check In - 08/26/23 0754       Check-In   Supervising physician immediately available to respond to emergencies See telemetry face sheet for immediately available ER MD    Location [facility]-Cardiac & Pulmonary Rehab    Staff Present Maud Sorenson, RN, BSN, CCRP;Noah Tickle, BS, Exercise Physiologist;Maxon Conetta BS, Exercise Physiologist;Margaret Best, MS, Exercise Physiologist    Virtual Visit No    Medication changes reported     No    Fall or balance concerns reported    No    Warm-up and Cool-down Performed on first and last piece of equipment    Resistance Training Performed Yes    VAD Patient? No    PAD/SET Patient? No      Pain Assessment   Currently in Pain? No/denies                Social History   Tobacco Use  Smoking Status Never  Smokeless Tobacco Never    Goals Met:  Independence with exercise equipment Exercise tolerated well Personal goals reviewed No report of concerns or symptoms today  Goals Unmet:  Not Applicable  Comments: Pt able to follow exercise prescription today without complaint.    Kurt Hammond graduated today from  rehab with 35 sessions completed.  Details of the patient's exercise prescription and what He needs to do in order to continue the prescription and progress were discussed with patient.  Patient was given a copy of prescription and goals.  Patient verbalized understanding. Kurt Hammond plans to continue to exercise by going to a gym  Dr. Firman Hughes is Medical Director for [facility] Cardiac Rehabilitation.  Dr. Fuad Aleskerov is Medical Director for [facility] Pulmonary Rehabilitation.

## 2023-08-26 NOTE — Progress Notes (Signed)
 Cardiac Individual Treatment Plan  Patient Details  Name: Kurt Hammond MRN: [id number] Date of Birth: April 28, 1960 Referring Provider:   Flowsheet Row Cardiac Rehab from 06/22/2023 in [facility] Cardiac and Pulmonary Rehab  Referring Provider Dr. Burney Carter, MD       Initial Encounter Date:  Flowsheet Row Cardiac Rehab from 06/22/2023 in [facility] Cardiac and Pulmonary Rehab  Date 06/22/23       Visit Diagnosis: Status post coronary artery stent placement  Patient's Home Medications on Admission:  Current Outpatient Medications:    albuterol (VENTOLIN HFA) 108 (90 Base) MCG/ACT inhaler, Inhale 2 puffs into the lungs every 6 (six) hours as needed for wheezing or shortness of breath. , Disp: , Rfl:    amiodarone  (PACERONE ) 200 MG tablet, Take 200 mg by mouth daily. (Patient not taking: Reported on 08/05/2023), Disp: , Rfl:    apixaban (ELIQUIS) 5 MG TABS tablet, Take 5 mg by mouth 2 (two) times daily., Disp: , Rfl:    aspirin  EC 81 MG tablet, Take 81 mg by mouth daily. Swallow whole., Disp: , Rfl:    clopidogrel (PLAVIX) 75 MG tablet, Take 75 mg by mouth daily., Disp: , Rfl:    colchicine  0.6 MG tablet, Take 1 tablet (0.6 mg total) by mouth 2 (two) times daily., Disp: 60 tablet, Rfl: 0   fluticasone (FLONASE) 50 MCG/ACT nasal spray, Place 2 sprays into both nostrils daily. Using as needed, Disp: , Rfl:    lisinopril  (ZESTRIL ) 2.5 MG tablet, Take 1 tablet (2.5 mg total) by mouth daily., Disp: 30 tablet, Rfl: 0   metoprolol  succinate (TOPROL -XL) 25 MG 24 hr tablet, Take 1 tablet by mouth daily., Disp: , Rfl:    oxyCODONE (OXY IR/ROXICODONE) 5 MG immediate release tablet, Take 5 mg by mouth every 6 (six) hours as needed., Disp: , Rfl:    pantoprazole (PROTONIX) 20 MG tablet, Take 20 mg by mouth daily., Disp: , Rfl:    rosuvastatin  (CRESTOR ) 40 MG tablet, Take 40 mg by mouth daily., Disp: , Rfl:    tamsulosin (FLOMAX) 0.4 MG CAPS capsule, Take 0.4 mg by mouth daily., Disp: , Rfl:     triamcinolone cream (KENALOG) 0.5 %, Apply 1 application topically 2 (two) times daily as needed (psoriasis)., Disp: , Rfl:    zolpidem  (AMBIEN  CR) 6.25 MG CR tablet, Take 6.25 mg by mouth at bedtime as needed., Disp: , Rfl:    zolpidem  (AMBIEN ) 10 MG tablet, Take 10 mg by mouth at bedtime as needed for sleep. , Disp: , Rfl: 0  Past Medical History: Past Medical History:  Diagnosis Date   Hyperlipidemia    Hypertension    Sleep difficulties     Tobacco Use: Social History   Tobacco Use  Smoking Status Never  Smokeless Tobacco Never    Labs: Review Flowsheet        No data to display           Exercise Target Goals: Exercise Program Goal: Individual exercise prescription set using results from initial 6 min walk test and THRR while considering  patient's activity barriers and safety.   Exercise Prescription Goal: Initial exercise prescription builds to 30-45 minutes a day of aerobic activity, 2-3 days per week.  Home exercise guidelines will be given to patient during program as part of exercise prescription that the participant will acknowledge.   Education: Aerobic Exercise: - Group verbal and visual presentation on the components of exercise prescription. Introduces F.I.T.T principle from ACSM for exercise prescriptions.  Reviews F.I.T.T. principles of aerobic exercise including progression. Written material given at graduation.   Education: Resistance Exercise: - Group verbal and visual presentation on the components of exercise prescription. Introduces F.I.T.T principle from ACSM for exercise prescriptions  Reviews F.I.T.T. principles of resistance exercise including progression. Written material given at graduation.    Education: Exercise & Equipment Safety: - Individual verbal instruction and demonstration of equipment use and safety with use of the equipment. Flowsheet Row Cardiac Rehab from 08/19/2023 in [facility] Cardiac and Pulmonary Rehab  Date 06/22/23   Educator NT  Instruction Review Code 1- Verbalizes Understanding       Education: Exercise Physiology & General Exercise Guidelines: - Group verbal and written instruction with models to review the exercise physiology of the cardiovascular system and associated critical values. Provides general exercise guidelines with specific guidelines to those with heart or lung disease.  Flowsheet Row Cardiac Rehab from 08/19/2023 in [facility] Cardiac and Pulmonary Rehab  Date 08/12/23  Educator NT  Instruction Review Code 1- [employer] Understanding       Education: Flexibility, Balance, Mind/Body Relaxation: - Group verbal and visual presentation with interactive activity on the components of exercise prescription. Introduces F.I.T.T principle from ACSM for exercise prescriptions. Reviews F.I.T.T. principles of flexibility and balance exercise training including progression. Also discusses the mind body connection.  Reviews various relaxation techniques to help reduce and manage stress (i.e. Deep breathing, progressive muscle relaxation, and visualization). Balance handout provided to take home. Written material given at graduation. Flowsheet Row Cardiac Rehab from 08/19/2023 in [facility] Cardiac and Pulmonary Rehab  Date 08/19/23  Educator NT  Instruction Review Code 1- Verbalizes Understanding       Activity Barriers & Risk Stratification:  Activity Barriers & Cardiac Risk Stratification - 06/22/23 1026       Activity Barriers & Cardiac Risk Stratification   Activity Barriers Shortness of Breath;Other (comment)    Comments Hx of rotator cuff surgery, degenerative joint disease    Cardiac Risk Stratification Moderate             6 Minute Walk:  6 Minute Walk     Row Name 06/22/23 1024 08/14/23 0749       6 Minute Walk   Phase Initial Discharge    Distance 1410 feet 1615 feet    Distance % Change -- 14.5 %    Distance Feet Change -- 205 ft    Walk Time 6 minutes 6 minutes    # of  Rest Breaks 0 0    MPH 2.67 3.06    METS 3.16 3.36    RPE 9 11    Perceived Dyspnea  0 0    VO2 Peak 11.05 11.75    Symptoms No No    Resting HR 65 bpm 58 bpm    Resting BP 106/64 110/60    Resting Oxygen Saturation  97 % 94 %    Exercise Oxygen Saturation  during 6 min walk 96 % 95 %    Max Ex. HR 91 bpm 70 bpm    Max Ex. BP 122/68 114/62    2 Minute Post BP 108/66 --             Oxygen Initial Assessment:   Oxygen Re-Evaluation:   Oxygen Discharge (Final Oxygen Re-Evaluation):   Initial Exercise Prescription:  Initial Exercise Prescription - 06/22/23 1000       Date of Initial Exercise RX and Referring Provider   Date 06/22/23    Referring Provider Dr.  Burney Carter, MD      Oxygen   Maintain Oxygen Saturation 88% or higher      Treadmill   MPH 2.6    Grade 1    Minutes 15    METs 3.3      NuStep   Level 3    SPM 80    Minutes 15    METs 3.16      REL-XR   Level 3    Speed 50    Minutes 15    METs 3.16      Prescription Details   Frequency (times per week) 3    Duration Progress to 30 minutes of continuous aerobic without signs/symptoms of physical distress      Intensity   THRR 40-80% of Max Heartrate 103-141    Ratings of Perceived Exertion 11-13    Perceived Dyspnea 0-4      Progression   Progression Continue to progress workloads to maintain intensity without signs/symptoms of physical distress.      Resistance Training   Training Prescription Yes    Weight 7 lb    Reps 10-15             Perform Capillary Blood Glucose checks as needed.  Exercise Prescription Changes:   Exercise Prescription Changes     Row Name 06/22/23 1000 07/09/23 1500 07/23/23 1800 07/27/23 0700 08/06/23 1300     Response to Exercise   Blood Pressure (Admit) 106/64 120/62 124/77 -- 114/66   Blood Pressure (Exercise) 122/68 134/66 150/70 -- --   Blood Pressure (Exit) 108/66 102/60 128/64 -- 100/62   Heart Rate (Admit) 65 bpm 79 bpm 70 bpm -- 77  bpm   Heart Rate (Exercise) 91 bpm 150 bpm 138 bpm -- 89 bpm   Heart Rate (Exit) 64 bpm 80 bpm 76 bpm -- 59 bpm   Oxygen Saturation (Admit) 97 % -- -- -- --   Oxygen Saturation (Exercise) 96 % -- -- -- --   Rating of Perceived Exertion (Exercise) 9 14 13  -- 14   Perceived Dyspnea (Exercise) 0 -- -- -- --   Symptoms none none none -- none   Comments Results First two weeks of exercise -- -- --   Duration -- Continue with 30 min of aerobic exercise without signs/symptoms of physical distress. Continue with 30 min of aerobic exercise without signs/symptoms of physical distress. Continue with 30 min of aerobic exercise without signs/symptoms of physical distress. Continue with 30 min of aerobic exercise without signs/symptoms of physical distress.   Intensity -- THRR unchanged THRR unchanged THRR unchanged THRR unchanged     Progression   Progression -- Continue to progress workloads to maintain intensity without signs/symptoms of physical distress. Continue to progress workloads to maintain intensity without signs/symptoms of physical distress. Continue to progress workloads to maintain intensity without signs/symptoms of physical distress. Continue to progress workloads to maintain intensity without signs/symptoms of physical distress.   Average METs -- 3.3 3.56 3.56 4.01     Resistance Training   Training Prescription -- Yes Yes Yes Yes   Weight -- 7 lb 10 lb 10 lb 10 lb   Reps -- 10-15 10-15 10-15 10-15     Interval Training   Interval Training -- No No No No     Treadmill   MPH -- 2.8 3 3 3    Grade -- 1 2 2 2    Minutes -- 15 15 15 15    METs -- 3.53 4.12 4.12 4.12  NuStep   Level -- 4 4 4 3    Minutes -- 15 15 15 15    METs -- 3 3.7 3.7 2.1     REL-XR   Level -- 4 4 4 5    Minutes -- 15 15 15 15    METs -- 3.8 4 4  4.5     T5 Nustep   Level -- 3 3 3  --   Minutes -- 15 15 15  --   METs -- 3.3 2.7 2.7 --     Rower   Level -- -- -- -- 6   Watts -- -- -- -- 44   Minutes  -- -- -- -- 15   METs -- -- -- -- 4.67     Home Exercise Plan   Plans to continue exercise at -- -- -- [employer] (comment)  gym where his son works [employer] (comment)  gym where his son works   Frequency -- -- -- Add 2 additional days to program exercise sessions. Add 2 additional days to program exercise sessions.   Initial Home Exercises Provided -- -- -- 07/27/23 07/27/23     Oxygen   Maintain Oxygen Saturation -- 88% or higher 88% or higher 88% or higher 88% or higher    Row Name 08/20/23 1800             Response to Exercise   Blood Pressure (Admit) 110/60       Blood Pressure (Exercise) 114/62       Blood Pressure (Exit) 100/58       Heart Rate (Admit) 58 bpm       Heart Rate (Exercise) 98 bpm       Heart Rate (Exit) 68 bpm       Oxygen Saturation (Admit) 95 %       Oxygen Saturation (Exercise) 90 %       Oxygen Saturation (Exit) 95 %       Rating of Perceived Exertion (Exercise) 14       Symptoms none       Duration Continue with 30 min of aerobic exercise without signs/symptoms of physical distress.       Intensity THRR unchanged         Progression   Progression Continue to progress workloads to maintain intensity without signs/symptoms of physical distress.       Average METs 4.54         Resistance Training   Training Prescription Yes       Weight 10 lb       Reps 10-15         Interval Training   Interval Training No         Treadmill   MPH 2.8       Grade 3       Minutes 15       METs 4.3         Elliptical   Level 3       Speed 4       Minutes 15       METs 6.3         REL-XR   Level 8       Minutes 15       METs 5.3         Rower   Level 5       Watts 40       Minutes 15       METs 4.86  Home Exercise Plan   Plans to continue exercise at [facility] (comment)  gym where his son works       Frequency Add 2 additional days to program exercise sessions.       Initial Home Exercises Provided 07/27/23          Oxygen   Maintain Oxygen Saturation 88% or higher                Exercise Comments:   Exercise Comments     Row Name 06/24/23 0830 08/26/23 0757         Exercise Comments First full day of exercise!  Patient was oriented to gym and equipment including functions, settings, policies, and procedures.  Patient's individual exercise prescription and treatment plan were reviewed.  All starting workloads were established based on the results of the 6 minute walk test done at initial orientation visit.  The plan for exercise progression was also introduced and progression will be customized based on patient's performance and goals. Supreme graduated today from  rehab with 34 sessions completed.  Details of the patient's exercise prescription and what He needs to do in order to continue the prescription and progress were discussed with patient.  Patient was given a copy of prescription and goals.  Patient verbalized understanding. Maleak plans to continue to exercise by going to a gym               Exercise Goals and Review:   Exercise Goals     Row Name 06/22/23 1026             Exercise Goals   Increase Physical Activity Yes       Intervention Provide advice, education, support and counseling about physical activity/exercise needs.;Develop an individualized exercise prescription for aerobic and resistive training based on initial evaluation findings, risk stratification, comorbidities and participant's personal goals.       Expected Outcomes Short Term: Attend rehab on a regular basis to increase amount of physical activity.;Long Term: Add in home exercise to make exercise part of routine and to increase amount of physical activity.;Long Term: Exercising regularly at least 3-5 days a week.       Increase Strength and Stamina Yes       Intervention Provide advice, education, support and counseling about physical activity/exercise needs.;Develop an individualized exercise  prescription for aerobic and resistive training based on initial evaluation findings, risk stratification, comorbidities and participant's personal goals.       Expected Outcomes Short Term: Increase workloads from initial exercise prescription for resistance, speed, and METs.;Long Term: Improve cardiorespiratory fitness, muscular endurance and strength as measured by increased METs and functional capacity ( );Short Term: Perform resistance training exercises routinely during rehab and add in resistance training at home       Able to understand and use rate of perceived exertion (RPE) scale Yes       Intervention Provide education and explanation on how to use RPE scale       Expected Outcomes Short Term: Able to use RPE daily in rehab to express subjective intensity level;Long Term:  Able to use RPE to guide intensity level when exercising independently       Able to understand and use Dyspnea scale Yes       Intervention Provide education and explanation on how to use Dyspnea scale       Expected Outcomes Long Term: Able to use Dyspnea scale to guide intensity level when exercising independently;Short Term: Able to  use Dyspnea scale daily in rehab to express subjective sense of shortness of breath during exertion       Knowledge and understanding of Target Heart Rate Range (THRR) Yes       Intervention Provide education and explanation of THRR including how the numbers were predicted and where they are located for reference       Expected Outcomes Short Term: Able to state/look up THRR;Long Term: Able to use THRR to govern intensity when exercising independently;Short Term: Able to use daily as guideline for intensity in rehab       Able to check pulse independently Yes       Intervention Provide education and demonstration on how to check pulse in carotid and radial arteries.;Review the importance of being able to check your own pulse for safety during independent exercise       Expected Outcomes  Short Term: Able to explain why pulse checking is important during independent exercise;Long Term: Able to check pulse independently and accurately       Understanding of Exercise Prescription Yes       Intervention Provide education, explanation, and written materials on patient's individual exercise prescription       Expected Outcomes Short Term: Able to explain program exercise prescription;Long Term: Able to explain home exercise prescription to exercise independently                Exercise Goals Re-Evaluation :  Exercise Goals Re-Evaluation     Row Name 06/24/23 0830 07/09/23 1530 07/23/23 1811 07/27/23 0748 08/06/23 1400     Exercise Goal Re-Evaluation   Exercise Goals Review Able to understand and use rate of perceived exertion (RPE) scale;Able to understand and use Dyspnea scale;Knowledge and understanding of Target Heart Rate Range (THRR);Understanding of Exercise Prescription Increase Physical Activity;Increase Strength and Stamina;Understanding of Exercise Prescription Increase Physical Activity;Increase Strength and Stamina;Understanding of Exercise Prescription Increase Physical Activity;Able to understand and use rate of perceived exertion (RPE) scale;Knowledge and understanding of Target Heart Rate Range (THRR);Understanding of Exercise Prescription;Increase Strength and Stamina;Able to understand and use Dyspnea scale;Able to check pulse independently Increase Physical Activity;Increase Strength and Stamina;Understanding of Exercise Prescription   Comments Reviewed RPE and dyspnea scale, THR and program prescription with pt today.  Pt voiced understanding and was given a copy of goals to take home. Jorden is off to a good start in the program. He did well on the treadmill at a speed of 2.8 mph with an incline of 1%. He also improved to level 4 on both the T4 nustep and XR. We will continue to monitor his progress in the program. Case is doing well in the program. He increased his  workload on the treadmill to a speed of and incline of 2%. He also increased to 10 lb weights for resistance. He maintained to level 4 on both the T4 nustep and XR. We will continue to monitor his progress in the program. Reviewed home exercise with pt today.  Pt plans to use a gym where his son works for exercise.  Reviewed THR, pulse, RPE, sign and symptoms, pulse oximetery and when to call 911 or MD.  Also discussed weather considerations and indoor options.  Pt voiced understanding. Fedrick is doing well in rehab. He was recently able to increase from level 4 to 5 on the XR. He was also able to add the rowing machine to his exercise prescription at level 6. We will continue to monitor his progress in the program.  Expected Outcomes Short: Use RPE daily to regulate intensity. Long: Follow program prescription in THR. Short: Continue to follow current exercise prescription. Long: Continue exercise to improve strength and stamina. Short: Continue to progressively increase workloads. Long: Continue exercise to improve strength and stamina. Short: add 1-2 days a week of exercise on off days of rehab. Long: maintain independent exercise routine upon graduation from cardiac rehab. Short: Continue to increase treadmill workload. Long: Continue exercise to improve strength and stamina.    Row Name 08/10/23 0758 08/20/23 1817           Exercise Goal Re-Evaluation   Exercise Goals Review Increase Physical Activity;Increase Strength and Stamina;Understanding of Exercise Prescription Increase Physical Activity;Increase Strength and Stamina;Understanding of Exercise Prescription      Comments Jawanza is doing well here at rehab, but has also started doing ore walking after dinner. getting 30-75mins of walking in most days. He is going to the gym on days he doesn't work out here at rehab. Quron continues to do well in rehab and will graduate soon. He improved on his post by 14.5% with 1664ft. He added the  elliptical at level 3 with a speed of 4 mph to his prescription. He also increased to level 8 on the XR and increased workload on treadmill to a speed of 2. and incline of 3%. We will continue to monitor his progress in the program until graduation.      Expected Outcomes STG: Continue to walk after dinner. Attend rehab regularly. LTG: Continue to improve strength and stamina Short: Graduate. Long: Continue to exercise independently.               Discharge Exercise Prescription (Final Exercise Prescription Changes):  Exercise Prescription Changes - 08/20/23 1800       Response to Exercise   Blood Pressure (Admit) 110/60    Blood Pressure (Exercise) 114/62    Blood Pressure (Exit) 100/58    Heart Rate (Admit) 58 bpm    Heart Rate (Exercise) 98 bpm    Heart Rate (Exit) 68 bpm    Oxygen Saturation (Admit) 95 %    Oxygen Saturation (Exercise) 90 %    Oxygen Saturation (Exit) 95 %    Rating of Perceived Exertion (Exercise) 14    Symptoms none    Duration Continue with 30 min of aerobic exercise without signs/symptoms of physical distress.    Intensity THRR unchanged      Progression   Progression Continue to progress workloads to maintain intensity without signs/symptoms of physical distress.    Average METs 4.54      Resistance Training   Training Prescription Yes    Weight 10 lb    Reps 10-15      Interval Training   Interval Training No      Treadmill   MPH 2.8    Grade 3    Minutes 15    METs 4.3      Elliptical   Level 3    Speed 4    Minutes 15    METs 6.3      REL-XR   Level 8    Minutes 15    METs 5.3      Rower   Level 5    Watts 40    Minutes 15    METs 4.86      Home Exercise Plan   Plans to continue exercise at [employer] (comment)   gym where his son works   Frequency Add  2 additional days to program exercise sessions.    Initial Home Exercises Provided 07/27/23      Oxygen   Maintain Oxygen Saturation 88% or higher              Nutrition:  Target Goals: Understanding of nutrition guidelines, daily intake of sodium 1500mg , cholesterol 200mg , calories 30% from fat and 7% or less from saturated fats, daily to have 5 or more servings of fruits and vegetables.  Education: All About Nutrition: -Group instruction provided by verbal, written material, interactive activities, discussions, models, and posters to present general guidelines for heart healthy nutrition including fat, fiber, MyPlate, the role of sodium in heart healthy nutrition, utilization of the nutrition label, and utilization of this knowledge for meal planning. Follow up email sent as well. Written material given at graduation. Flowsheet Row Cardiac Rehab from 08/19/2023 in [facility] Cardiac and Pulmonary Rehab  Education need identified 06/22/23       Biometrics:  Pre Biometrics - 06/22/23 1030       Pre Biometrics   Height 5' 10.25" (1.784 m)    Weight 235 lb 1.6 oz (106.6 kg)    Waist Circumference 42 inches    Hip Circumference 43 inches    Waist to Hip Ratio 0.98 %    BMI (Calculated) 33.51    Single Leg Stand 30 seconds             Post Biometrics - 08/14/23 0752        Post  Biometrics   Height 5' 10.25" (1.784 m)    Weight 227 lb (103 kg)    Waist Circumference 42 inches    Hip Circumference 43 inches    Waist to Hip Ratio 0.98 %    BMI (Calculated) 32.35    Single Leg Stand 30 seconds             Nutrition Therapy Plan and Nutrition Goals:  Nutrition Therapy & Goals - 06/22/23 1532       Nutrition Therapy   Diet Cardiac, Low Na    Protein (specify units) 90    Fiber 30 grams    Whole Grain Foods 3 servings    Saturated Fats 15 max. grams    Fruits and Vegetables 5 servings/day    Sodium 2 grams      Personal Nutrition Goals   Nutrition Goal Read labels and reduce sodium intake to below 2300mg . Ideally 1500mg  per day.    Personal Goal #2 Reduce saturated fat, less than 12g per day. Replace bad fats for  more heart healthy fats.    Personal Goal #3 Include more colorful produce, aim for 5-8 servings of fruits and veggies per day    Comments Patient drinking mostly water, around 64oz daily. Drinks 2 cups of coffee most mornings. He reports his Dr recommended he keep caffeine intake low. RD suggested as low as possible, but should be below 400mg  daily. Reviewed mediterranean diet handout. Educated on types of fats, sources, how to read labels. Set goal to read labels and monitor sodium intake to less than 1500mg  daily. Encouraged him to reduce his saturated fat intake and provided target of less than 15g per day. Build out a few meals and snacks with food he likes and will eat, focusing on low calorie nutrient rich foods low in sodium and saturated fat.      Intervention Plan   Intervention Prescribe, educate and counsel regarding individualized specific dietary modifications aiming towards targeted core components such  as weight, hypertension, lipid management, diabetes, heart failure and other comorbidities.;Nutrition handout(s) given to patient.    Expected Outcomes Short Term Goal: Understand basic principles of dietary content, such as calories, fat, sodium, cholesterol and nutrients.;Short Term Goal: A plan has been developed with personal nutrition goals set during dietitian appointment.;Long Term Goal: Adherence to prescribed nutrition plan.             Nutrition Assessments:  MEDIFICTS Score Key: >=70 Need to make dietary changes  40-70 Heart Healthy Diet <= 40 Therapeutic Level Cholesterol Diet  Flowsheet Row Cardiac Rehab from 08/21/2023 in [facility] Cardiac and Pulmonary Rehab  Picture Your Plate Total Score on Admission 55  Picture Your Plate Total Score on Discharge 71      Picture Your Plate Scores: <19 Unhealthy dietary pattern with much room for improvement. 41-50 Dietary pattern unlikely to meet recommendations for good health and room for improvement. 51-60 More healthful  dietary pattern, with some room for improvement.  >60 Healthy dietary pattern, although there may be some specific behaviors that could be improved.    Nutrition Goals Re-Evaluation:  Nutrition Goals Re-Evaluation     Row Name 07/22/23 0741 08/10/23 0810           Goals   Current Weight 228 lb 6.4 oz (103.6 kg) --      Comment Johnathin is doing well managing his diet. He is still trying to include more vegetables, and trying to cut back on red meat. Knut reports he and his wife have been including more veggies and fish. Says he has lost some weight since meeting with RD. He is also reading labels more and is more aware of sodium. He knows to stay below 1500mg  for optimal heart health.      Expected Outcome Short: Continue to follow heart healthy diet. Long: Continue to work towards weight loss, and reduce sodium intake. STG: Conitnue to build good eating habits and eating patterns. LTG: Continue to work towards weight loss, and reduce sodium intake.               Nutrition Goals Discharge (Final Nutrition Goals Re-Evaluation):  Nutrition Goals Re-Evaluation - 08/10/23 0810       Goals   Comment Kaniel reports he and his wife have been including more veggies and fish. Says he has lost some weight since meeting with RD. He is also reading labels more and is more aware of sodium. He knows to stay below 1500mg  for optimal heart health.    Expected Outcome STG: Conitnue to build good eating habits and eating patterns. LTG: Continue to work towards weight loss, and reduce sodium intake.             Psychosocial: Target Goals: Acknowledge presence or absence of significant depression and/or stress, maximize coping skills, provide positive support system. Participant is able to verbalize types and ability to use techniques and skills needed for reducing stress and depression.   Education: Stress, Anxiety, and Depression - Group verbal and visual presentation to define topics covered.   Reviews how body is impacted by stress, anxiety, and depression.  Also discusses healthy ways to reduce stress and to treat/manage anxiety and depression.  Written material given at graduation. Flowsheet Row Cardiac Rehab from 08/19/2023 in [facility] Cardiac and Pulmonary Rehab  Date 08/05/23  Educator SB  Instruction Review Code 1- [employer] Understanding       Education: Sleep Hygiene -Provides group verbal and written instruction about how sleep can affect your  health.  Define sleep hygiene, discuss sleep cycles and impact of sleep habits. Review good sleep hygiene tips.    Initial Review & Psychosocial Screening:  Initial Psych Review & Screening - 06/18/23 1356       Initial Review   Current issues with None Identified      Family Dynamics   Good Support System? Yes   wife, Burdette Carolin.     Screening Interventions   Interventions Encouraged to exercise;To provide support and resources with identified psychosocial needs;Provide feedback about the scores to participant    Expected Outcomes Short Term goal: Utilizing psychosocial counselor, staff and physician to assist with identification of specific Stressors or current issues interfering with healing process. Setting desired goal for each stressor or current issue identified.;Long Term Goal: Stressors or current issues are controlled or eliminated.;Short Term goal: Identification and review with participant of any Quality of Life or Depression concerns found by scoring the questionnaire.;Long Term goal: The participant improves quality of Life and PHQ9 Scores as seen by post scores and/or verbalization of changes             Quality of Life Scores:   Quality of Life - 08/21/23 0906       Quality of Life Scores   Health/Function Pre 23.33 %    Health/Function Post 21.3 %    Health/Function % Change -8.7 %    Socioeconomic Pre 22.06 %    Socioeconomic Post 24.64 %    Socioeconomic % Change  11.7 %    Psych/Spiritual Pre 24.43 %     Psych/Spiritual Post 24.5 %    Psych/Spiritual % Change 0.29 %    Family Pre 30 %    Family Post 28.3 %    Family % Change -5.67 %    GLOBAL Pre 24.21 %    GLOBAL Post 23.68 %    GLOBAL % Change -2.19 %            Scores of 19 and below usually indicate a poorer quality of life in these areas.  A difference of  2-3 points is a clinically meaningful difference.  A difference of 2-3 points in the total score of the Quality of Life Index has been associated with significant improvement in overall quality of life, self-image, physical symptoms, and general health in studies assessing change in quality of life.  PHQ-9: Review Flowsheet       08/21/2023 06/22/2023  Depression screen PHQ 2/9  Decreased Interest 0 0  Down, Depressed, Hopeless 0 0  PHQ - 2 Score 0 0  Altered sleeping 1 2  Tired, decreased energy 1 0  Change in appetite 0 0  Feeling bad or failure about yourself  0 0  Trouble concentrating 0 0  Moving slowly or fidgety/restless 0 0  Suicidal thoughts 0 0  PHQ-9 Score 2 2  Difficult doing work/chores Not difficult at all Not difficult at all   Interpretation of Total Score  Total Score Depression Severity:  1-4 = Minimal depression, 5-9 = Mild depression, 10-14 = Moderate depression, 15-19 = Moderately severe depression, 20-27 = Severe depression   Psychosocial Evaluation and Intervention:  Psychosocial Evaluation - 06/18/23 1403       Psychosocial Evaluation & Interventions   Interventions Encouraged to exercise with the program and follow exercise prescription    Comments No barriers to starting the program. Wants to lose weight, improve shortness of breath and learn heart healthy lifestyle steps.  He lives with his wife and  she is his support.  He does have some sleep concerns that have been present for year or more. His pulmonary doctor is referring him to a Sleep doctor in [place].   He is waiting for that office to call him.   He should do well with the program     Expected Outcomes STG attends all scheduled sessions, meets with RD and follows exercise progression while in the program. Has appointment with a Sleep doctor.  LTG Continues with exercise progression, working on his weight loss and getting help with his sleep concerns after discharge    Continue Psychosocial Services  Follow up required by staff             Psychosocial Re-Evaluation:  Psychosocial Re-Evaluation     Row Name 07/22/23 0736 08/10/23 0806           Psychosocial Re-Evaluation   Current issues with None Identified None Identified      Comments Rahm is doing well. He reports having no stressors at this time. He cites his wife and his sons as his support system. Hoy manages his stress through golfing with friends, and fishing. Ikeem denies any anxiety stress or depression. Has a good support system. He reports he has been sleeping much better. Getting 7hrs most night waking well rested.      Expected Outcomes Short: Continue to manage stress through healthy outlets. Long: Continue to exercise in and out of the program to manage stressors. STG: Continue to focus on good sleep and attend rehab. LTG: Continue to exercise in and out of the program to manage stressors.      Interventions -- Encouraged to attend Cardiac Rehabilitation for the exercise      Continue Psychosocial Services  Follow up required by staff Follow up required by staff               Psychosocial Discharge (Final Psychosocial Re-Evaluation):  Psychosocial Re-Evaluation - 08/10/23 0806       Psychosocial Re-Evaluation   Current issues with None Identified    Comments Taseen denies any anxiety stress or depression. Has a good support system. He reports he has been sleeping much better. Getting 7hrs most night waking well rested.    Expected Outcomes STG: Continue to focus on good sleep and attend rehab. LTG: Continue to exercise in and out of the program to manage stressors.    Interventions  Encouraged to attend Cardiac Rehabilitation for the exercise    Continue Psychosocial Services  Follow up required by staff             Vocational Rehabilitation: Provide vocational rehab assistance to qualifying candidates.   Vocational Rehab Evaluation & Intervention:  Vocational Rehab - 08/21/23 0906       Initial Vocational Rehab Evaluation & Intervention   Assessment shows need for Vocational Rehabilitation No      Vocational Rehab Re-Evaulation   Comments back at his job      Discharge Vocational Rehab   Discharge Vocational Rehabilitation working at his job   no VR needed             Education: Education Goals: Education classes will be provided on a variety of topics geared toward better understanding of heart health and risk factor modification. Participant will state understanding/return demonstration of topics presented as noted by education test scores.  Learning Barriers/Preferences:   General Cardiac Education Topics:  AED/CPR: - Group verbal and written instruction with the use of models to demonstrate  the basic use of the AED with the basic ABC's of resuscitation.   Anatomy and Cardiac Procedures: - Group verbal and visual presentation and models provide information about basic cardiac anatomy and function. Reviews the testing methods done to diagnose heart disease and the outcomes of the test results. Describes the treatment choices: Medical Management, Angioplasty, or Coronary Bypass Surgery for treating various heart conditions including Myocardial Infarction, Angina, Valve Disease, and Cardiac Arrhythmias.  Written material given at graduation. Flowsheet Row Cardiac Rehab from 08/19/2023 in [facility] Cardiac and Pulmonary Rehab  Education need identified 06/22/23  Date 07/08/23  Educator sb  Instruction Review Code 1- Verbalizes Understanding       Medication Safety: - Group verbal and visual instruction to review commonly prescribed medications  for heart and lung disease. Reviews the medication, class of the drug, and side effects. Includes the steps to properly store meds and maintain the prescription regimen.  Written material given at graduation. Flowsheet Row Cardiac Rehab from 08/19/2023 in [facility] Cardiac and Pulmonary Rehab  Date 07/15/23  Educator SB  Instruction Review Code 1- Verbalizes Understanding       Intimacy: - Group verbal instruction through game format to discuss how heart and lung disease can affect sexual intimacy. Written material given at graduation..   Know Your Numbers and Heart Failure: - Group verbal and visual instruction to discuss disease risk factors for cardiac and pulmonary disease and treatment options.  Reviews associated critical values for Overweight/Obesity, Hypertension, Cholesterol, and Diabetes.  Discusses basics of heart failure: signs/symptoms and treatments.  Introduces Heart Failure Zone chart for action plan for heart failure.  Written material given at graduation. Flowsheet Row Cardiac Rehab from 08/19/2023 in [facility] Cardiac and Pulmonary Rehab  Date 07/22/23  Educator SB  Instruction Review Code 1- Verbalizes Understanding       Infection Prevention: - Provides verbal and written material to individual with discussion of infection control including proper hand washing and proper equipment cleaning during exercise session. Flowsheet Row Cardiac Rehab from 08/19/2023 in [facility] Cardiac and Pulmonary Rehab  Date 06/22/23  Educator NT  Instruction Review Code 1- Verbalizes Understanding       Falls Prevention: - Provides verbal and written material to individual with discussion of falls prevention and safety. Flowsheet Row Cardiac Rehab from 08/19/2023 in [facility] Cardiac and Pulmonary Rehab  Date 06/18/23  Educator SB  Instruction Review Code 1- Verbalizes Understanding       Other: -Provides group and verbal instruction on various topics (see comments)   Knowledge Questionnaire  Score:  Knowledge Questionnaire Score - 08/21/23 0905       Knowledge Questionnaire Score   Pre Score 23/26    Post Score 25/26             Core Components/Risk Factors/Patient Goals at Admission:  Personal Goals and Risk Factors at Admission - 06/18/23 1359       Core Components/Risk Factors/Patient Goals on Admission    Weight Management Yes    Intervention Weight Management: Develop a combined nutrition and exercise program designed to reach desired caloric intake, while maintaining appropriate intake of nutrient and fiber, sodium and fats, and appropriate energy expenditure required for the weight goal.;Weight Management/Obesity: Establish reasonable short term and long term weight goals.    Admit Weight 230 lb (104.3 kg)    Goal Weight: Short Term 228 lb (103.4 kg)    Goal Weight: Long Term 210 lb (95.3 kg)    Expected Outcomes Long Term: Adherence to  nutrition and physical activity/exercise program aimed toward attainment of established weight goal;Short Term: Continue to assess and modify interventions until short term weight is achieved;Weight Loss: Understanding of general recommendations for a balanced deficit meal plan, which promotes 1-2 lb weight loss per week and includes a negative energy balance of [phone] kcal/d    Lipids Yes    Intervention Provide education and support for participant on nutrition & aerobic/resistive exercise along with prescribed medications to achieve LDL 70mg , HDL >40mg .    Expected Outcomes Short Term: Participant states understanding of desired cholesterol values and is compliant with medications prescribed. Participant is following exercise prescription and nutrition guidelines.;Long Term: Cholesterol controlled with medications as prescribed, with individualized exercise RX and with personalized nutrition plan. Value goals: LDL < 70mg , HDL > 40 mg.             Education:Diabetes - Individual verbal and written instruction to review  signs/symptoms of diabetes, desired ranges of glucose level fasting, after meals and with exercise. Acknowledge that pre and post exercise glucose checks will be done for 3 sessions at entry of program.   Core Components/Risk Factors/Patient Goals Review:   Goals and Risk Factor Review     Row Name 07/22/23 0746 08/10/23 4696           Core Components/Risk Factors/Patient Goals Review   Personal Goals Review Weight Management/Obesity Weight Management/Obesity      Review Kazuo is still trying to lose some weight. He ideally wants to be around the 210lb mark, but he has lost a few pounds and that seems to be encouraging. Wenzel is eating better and has lost some weight. Is focused on reaching his goal wight of ~210lbs. Reminded him habits and long term sustainabilty are important for weight loss and strong body.      Expected Outcomes Short: Continue to follow a heart healthy diet combined with exercise in order to lose weight. Long: Continue to exercise at home, and in the program to attain weight goal of 210lb. STG: Continue to work on building good habits and lose 10lbs. LTG: Continue to exercise at home, and in the program to attain weight goal of 210lb.               Core Components/Risk Factors/Patient Goals at Discharge (Final Review):   Goals and Risk Factor Review - 08/10/23 0812       Core Components/Risk Factors/Patient Goals Review   Personal Goals Review Weight Management/Obesity    Review Nihaal is eating better and has lost some weight. Is focused on reaching his goal wight of ~210lbs. Reminded him habits and long term sustainabilty are important for weight loss and strong body.    Expected Outcomes STG: Continue to work on building good habits and lose 10lbs. LTG: Continue to exercise at home, and in the program to attain weight goal of 210lb.             ITP Comments:  ITP Comments     Row Name 06/18/23 1410 06/22/23 1023 06/24/23 0830 07/08/23 1159 07/08/23 1200    ITP Comments Virtual orientation call completed today. he has an appointment on Date: 06/22/2023  for EP eval and gym Orientation.  Documentation of diagnosis can be found in [facility] 06/09/2023 . Completed and gym orientation. Initial ITP created and sent for review to Dr. Firman Hughes, Medical Director. First full day of exercise!  Patient was oriented to gym and equipment including functions, settings, policies, and procedures.  Patient's individual  exercise prescription and treatment plan were reviewed.  All starting workloads were established based on the results of the 6 minute walk test done at initial orientation visit.  The plan for exercise progression was also introduced and progression will be customized based on patient's performance and goals. 30 Day review completed. Medical Director ITP review done, changes made as directed, and signed approval by Medical Director.    new to program 30 Day review completed. Medical Director ITP review done, changes made as directed, and signed approval by Medical Director.    Row Name 08/05/23 1318 08/26/23 0756         ITP Comments 30 Day review completed. Medical Director ITP review done, changes made as directed, and signed approval by Medical Director. Shakil graduated today from  rehab with 34 sessions completed.  Details of the patient's exercise prescription and what He needs to do in order to continue the prescription and progress were discussed with patient.  Patient was given a copy of prescription and goals.  Patient verbalized understanding. Keath plans to continue to exercise by going to a gym               Comments: Discharge ITP

## 2023-08-26 NOTE — Progress Notes (Signed)
 Discharge Note for  Kurt Hammond     12-05-1960         Kurt Hammond graduated today from  rehab with 35sessions completed.  Details of the patient's exercise prescription and what He needs to do in order to continue the prescription and progress were discussed with patient.  Patient was given a copy of prescription and goals.  Patient verbalized understanding. [name] plans to continue to exercise by going to a gym    6 Minute Walk     Row Name 06/22/23 1024 08/14/23 0749       6 Minute Walk   Phase Initial Discharge    Distance 1410 feet 1615 feet    Distance % Change -- 14.5 %    Distance Feet Change -- 205 ft    Walk Time 6 minutes 6 minutes    # of Rest Breaks 0 0    MPH 2.67 3.06    METS 3.16 3.36    RPE 9 11    Perceived Dyspnea  0 0    VO2 Peak 11.05 11.75    Symptoms No No    Resting HR 65 bpm 58 bpm    Resting BP 106/64 110/60    Resting Oxygen Saturation  97 % 94 %    Exercise Oxygen Saturation  during 6 min walk 96 % 95 %    Max Ex. HR 91 bpm 70 bpm    Max Ex. BP 122/68 114/62    2 Minute Post BP 108/66 --

## 2023-08-28 ENCOUNTER — Encounter

## 2023-08-31 ENCOUNTER — Encounter

## 2023-09-02 ENCOUNTER — Encounter

## 2023-09-04 ENCOUNTER — Encounter

## 2023-09-07 ENCOUNTER — Encounter

## 2023-09-09 ENCOUNTER — Encounter

## 2023-09-11 ENCOUNTER — Encounter

## 2023-09-14 ENCOUNTER — Encounter

## 2023-09-16 ENCOUNTER — Encounter

## 2023-09-18 ENCOUNTER — Encounter

## 2023-09-21 ENCOUNTER — Encounter

## 2023-09-22 ENCOUNTER — Telehealth (INDEPENDENT_AMBULATORY_CARE_PROVIDER_SITE_OTHER): Payer: Self-pay | Admitting: Vascular Surgery

## 2023-09-22 NOTE — Telephone Encounter (Signed)
 LVM for pt TCB and schedule laser ablation appt(s)  left leg GSV laser. see gs. shara #[id number] exp: 7.14.25 - 10.31.25   1 week post left leg GSV laser  4 week post left leg GSV laser. See fb/gs

## 2023-09-29 ENCOUNTER — Telehealth (INDEPENDENT_AMBULATORY_CARE_PROVIDER_SITE_OTHER): Payer: Self-pay | Admitting: Vascular Surgery

## 2023-09-29 NOTE — Telephone Encounter (Signed)
 LVM for pt TCB and schedule laser ablation appt(s)  left leg GSV laser. see gs. shara #[id number] exp: 7.14.25 - 10.31.25   1 week post left leg GSV laser  4 week post left leg GSV laser. See fb/gs

## 2023-10-01 ENCOUNTER — Telehealth (INDEPENDENT_AMBULATORY_CARE_PROVIDER_SITE_OTHER): Payer: Self-pay | Admitting: Vascular Surgery

## 2023-10-01 NOTE — Telephone Encounter (Signed)
 Spoke with pt to try and schedule laser ablation appt recommended by Dr. Jama. Patient wants to call insurance and see what out of pocket costs will be. I provided CPT [phone] with a ICD-10 of I83.12. I advised that it is an in office procedure called laser ablation. Pt will call insurance and give me a call back after to schedule.
